# Patient Record
Sex: Male | Born: 1935 | Race: White | Hispanic: No
Health system: Southern US, Community
[De-identification: ages and names within clinical notes are randomized; demographics above are authoritative.]

## PROBLEM LIST (undated history)

## (undated) DIAGNOSIS — J449 Chronic obstructive pulmonary disease, unspecified: Secondary | ICD-10-CM

## (undated) DIAGNOSIS — I2510 Atherosclerotic heart disease of native coronary artery without angina pectoris: Secondary | ICD-10-CM

## (undated) DIAGNOSIS — K219 Gastro-esophageal reflux disease without esophagitis: Secondary | ICD-10-CM

## (undated) DIAGNOSIS — F329 Major depressive disorder, single episode, unspecified: Secondary | ICD-10-CM

## (undated) DIAGNOSIS — G4700 Insomnia, unspecified: Secondary | ICD-10-CM

## (undated) DIAGNOSIS — R609 Edema, unspecified: Secondary | ICD-10-CM

## (undated) DIAGNOSIS — F0390 Unspecified dementia without behavioral disturbance: Secondary | ICD-10-CM

## (undated) DIAGNOSIS — G894 Chronic pain syndrome: Secondary | ICD-10-CM

## (undated) DIAGNOSIS — I4891 Unspecified atrial fibrillation: Secondary | ICD-10-CM

---

## 2007-03-25 ENCOUNTER — Ambulatory Visit: Payer: Self-pay | Admitting: Family Medicine

## 2007-07-30 ENCOUNTER — Ambulatory Visit: Payer: Self-pay | Admitting: Urology

## 2007-07-30 ENCOUNTER — Other Ambulatory Visit: Payer: Self-pay

## 2007-08-13 ENCOUNTER — Ambulatory Visit: Payer: Self-pay | Admitting: Urology

## 2008-02-22 ENCOUNTER — Emergency Department: Payer: Self-pay | Admitting: Emergency Medicine

## 2008-02-22 ENCOUNTER — Other Ambulatory Visit: Payer: Self-pay

## 2008-03-04 ENCOUNTER — Emergency Department: Payer: Self-pay | Admitting: Internal Medicine

## 2008-03-04 ENCOUNTER — Other Ambulatory Visit: Payer: Self-pay

## 2009-10-27 ENCOUNTER — Inpatient Hospital Stay: Payer: Self-pay | Admitting: Internal Medicine

## 2010-02-24 ENCOUNTER — Ambulatory Visit: Payer: Self-pay

## 2012-06-20 ENCOUNTER — Inpatient Hospital Stay: Payer: Self-pay | Admitting: Internal Medicine

## 2012-06-20 LAB — CBC WITH DIFFERENTIAL/PLATELET
Basophil #: 0.1 10*3/uL (ref 0.0–0.1)
Basophil %: 1.1 %
Eosinophil %: 0.7 %
HCT: 41.9 % (ref 40.0–52.0)
HGB: 14.1 g/dL (ref 13.0–18.0)
Lymphocyte #: 1.5 10*3/uL (ref 1.0–3.6)
Lymphocyte %: 14.6 %
MCH: 29.8 pg (ref 26.0–34.0)
MCHC: 33.7 g/dL (ref 32.0–36.0)
MCV: 88 fL (ref 80–100)
Monocyte #: 0.7 x10 3/mm (ref 0.2–1.0)
Monocyte %: 6.6 %
Neutrophil %: 77 %
Platelet: 191 10*3/uL (ref 150–440)

## 2012-06-20 LAB — URINALYSIS, COMPLETE
Bacteria: NONE SEEN
Bilirubin,UR: NEGATIVE
Blood: NEGATIVE
Glucose,UR: NEGATIVE mg/dL (ref 0–75)
Specific Gravity: 1.017 (ref 1.003–1.030)
Squamous Epithelial: <1

## 2012-06-20 LAB — COMPREHENSIVE METABOLIC PANEL
Albumin: 4.2 g/dL (ref 3.4–5.0)
Alkaline Phosphatase: 80 U/L (ref 50–136)
Calcium, Total: 10.3 mg/dL — ABNORMAL HIGH (ref 8.5–10.1)
Chloride: 104 mmol/L (ref 98–107)
Creatinine: 1.25 mg/dL (ref 0.60–1.30)
EGFR (Non-African Amer.): 55 — ABNORMAL LOW
Glucose: 86 mg/dL (ref 65–99)
Osmolality: 272 (ref 275–301)
Potassium: 6.2 mmol/L — ABNORMAL HIGH (ref 3.5–5.1)

## 2012-06-20 LAB — PRO B NATRIURETIC PEPTIDE: B-Type Natriuretic Peptide: 659 pg/mL — ABNORMAL HIGH (ref 0–450)

## 2012-06-20 LAB — TROPONIN I: Troponin-I: <0.02 ng/mL

## 2012-06-20 LAB — LIPASE, BLOOD: Lipase: 177 U/L (ref 73–393)

## 2012-06-21 LAB — BASIC METABOLIC PANEL
Calcium, Total: 9.5 mg/dL (ref 8.5–10.1)
Chloride: 105 mmol/L (ref 98–107)
Co2: 23 mmol/L (ref 21–32)
Potassium: 4.1 mmol/L (ref 3.5–5.1)
Sodium: 139 mmol/L (ref 136–145)

## 2012-06-21 LAB — LIPID PANEL
Cholesterol: 113 mg/dL (ref 0–200)
HDL Cholesterol: 44 mg/dL (ref 40–60)
Triglycerides: 54 mg/dL (ref 0–200)
VLDL Cholesterol, Calc: 11 mg/dL (ref 5–40)

## 2012-06-24 LAB — CREATININE, SERUM
Creatinine: 1.3 mg/dL (ref 0.60–1.30)
EGFR (African American): >60
EGFR (Non-African Amer.): 52 — ABNORMAL LOW

## 2012-08-07 ENCOUNTER — Ambulatory Visit: Payer: Self-pay | Admitting: Family Medicine

## 2012-08-07 ENCOUNTER — Inpatient Hospital Stay: Payer: Self-pay | Admitting: Specialist

## 2012-08-07 LAB — CBC
HCT: 34.5 % — ABNORMAL LOW
HGB: 11 g/dL — ABNORMAL LOW
MCH: 28.1 pg
MCHC: 31.8 g/dL — ABNORMAL LOW
MCV: 88 fL
Platelet: 216 x10 3/mm 3
RBC: 3.91 x10 6/mm 3 — ABNORMAL LOW
RDW: 16.4 % — ABNORMAL HIGH
WBC: 7.3 x10 3/mm 3

## 2012-08-07 LAB — COMPREHENSIVE METABOLIC PANEL WITH GFR
Albumin: 3.1 g/dL — ABNORMAL LOW
Alkaline Phosphatase: 89 U/L
Anion Gap: 8
BUN: 14 mg/dL
Bilirubin,Total: 0.4 mg/dL
Calcium, Total: 9.9 mg/dL
Chloride: 105 mmol/L
Co2: 28 mmol/L
Creatinine: 1.32 mg/dL — ABNORMAL HIGH
EGFR (African American): 59 — ABNORMAL LOW
EGFR (Non-African Amer.): 51 — ABNORMAL LOW
Glucose: 95 mg/dL
Osmolality: 282
Potassium: 3.7 mmol/L
SGOT(AST): 17 U/L
SGPT (ALT): 14 U/L
Sodium: 141 mmol/L
Total Protein: 6.9 g/dL

## 2012-08-07 LAB — URINALYSIS, COMPLETE
Bacteria: NONE SEEN
Blood: NEGATIVE
Glucose,UR: NEGATIVE mg/dL (ref 0–75)
Ketone: NEGATIVE
Leukocyte Esterase: NEGATIVE
Ph: 5 (ref 4.5–8.0)
Protein: NEGATIVE
Specific Gravity: 1.018 (ref 1.003–1.030)
Squamous Epithelial: NONE SEEN

## 2012-08-07 LAB — TROPONIN I: Troponin-I: <0.02 ng/mL

## 2012-08-07 LAB — MAGNESIUM: Magnesium: 2 mg/dL

## 2012-08-07 LAB — CK TOTAL AND CKMB (NOT AT ARMC)
CK, Total: 55 U/L
CK-MB: 1.2 ng/mL

## 2012-08-08 LAB — CK TOTAL AND CKMB (NOT AT ARMC)
CK, Total: 38 U/L (ref 35–232)
CK-MB: 0.8 ng/mL (ref 0.5–3.6)

## 2012-08-08 LAB — TROPONIN I: Troponin-I: <0.02 ng/mL

## 2012-08-23 ENCOUNTER — Emergency Department: Payer: Self-pay | Admitting: Emergency Medicine

## 2012-08-23 LAB — URINALYSIS, COMPLETE
Bacteria: NONE SEEN
Bilirubin,UR: NEGATIVE
Leukocyte Esterase: NEGATIVE
RBC,UR: 2 /HPF (ref 0–5)
Squamous Epithelial: NONE SEEN
WBC UR: 1 /HPF (ref 0–5)

## 2012-08-23 LAB — COMPREHENSIVE METABOLIC PANEL
Albumin: 3.1 g/dL — ABNORMAL LOW (ref 3.4–5.0)
Alkaline Phosphatase: 76 U/L (ref 50–136)
Anion Gap: 6 — ABNORMAL LOW (ref 7–16)
BUN: 19 mg/dL — ABNORMAL HIGH (ref 7–18)
Calcium, Total: 9.4 mg/dL (ref 8.5–10.1)
Co2: 28 mmol/L (ref 21–32)
Creatinine: 1.3 mg/dL (ref 0.60–1.30)
EGFR (Non-African Amer.): 52 — ABNORMAL LOW
Glucose: 79 mg/dL (ref 65–99)
Osmolality: 286 (ref 275–301)
SGPT (ALT): 20 U/L (ref 12–78)
Sodium: 143 mmol/L (ref 136–145)

## 2012-08-23 LAB — CBC
HGB: 12 g/dL — ABNORMAL LOW (ref 13.0–18.0)
MCH: 29.8 pg (ref 26.0–34.0)
MCHC: 33.7 g/dL (ref 32.0–36.0)
Platelet: 127 10*3/uL — ABNORMAL LOW (ref 150–440)
RBC: 4.02 10*6/uL — ABNORMAL LOW (ref 4.40–5.90)
RDW: 17.3 % — ABNORMAL HIGH (ref 11.5–14.5)

## 2013-04-19 ENCOUNTER — Emergency Department: Payer: Self-pay | Admitting: Emergency Medicine

## 2013-09-01 ENCOUNTER — Inpatient Hospital Stay: Payer: Self-pay | Admitting: Internal Medicine

## 2013-09-01 LAB — COMPREHENSIVE METABOLIC PANEL
Albumin: 3.4 g/dL (ref 3.4–5.0)
Alkaline Phosphatase: 70 U/L
Anion Gap: 4 — ABNORMAL LOW (ref 7–16)
BUN: 32 mg/dL — ABNORMAL HIGH (ref 7–18)
Calcium, Total: 11.1 mg/dL — ABNORMAL HIGH (ref 8.5–10.1)
Chloride: 107 mmol/L (ref 98–107)
Co2: 26 mmol/L (ref 21–32)
EGFR (African American): 39 — ABNORMAL LOW
EGFR (Non-African Amer.): 33 — ABNORMAL LOW
Glucose: 111 mg/dL — ABNORMAL HIGH (ref 65–99)
Potassium: 3.7 mmol/L (ref 3.5–5.1)
SGOT(AST): 30 U/L (ref 15–37)
Sodium: 137 mmol/L (ref 136–145)
Total Protein: 7.4 g/dL (ref 6.4–8.2)

## 2013-09-01 LAB — CK TOTAL AND CKMB (NOT AT ARMC): CK-MB: <0.5 ng/mL — ABNORMAL LOW (ref 0.5–3.6)

## 2013-09-01 LAB — CBC
HGB: 12.6 g/dL — ABNORMAL LOW (ref 13.0–18.0)
MCH: 28.8 pg (ref 26.0–34.0)
MCHC: 33.3 g/dL (ref 32.0–36.0)
MCV: 87 fL (ref 80–100)
Platelet: 178 10*3/uL (ref 150–440)
RBC: 4.36 10*6/uL — ABNORMAL LOW (ref 4.40–5.90)
RDW: 14.9 % — ABNORMAL HIGH (ref 11.5–14.5)
WBC: 19.3 10*3/uL — ABNORMAL HIGH (ref 3.8–10.6)

## 2013-09-01 LAB — PRO B NATRIURETIC PEPTIDE: B-Type Natriuretic Peptide: 935 pg/mL — ABNORMAL HIGH (ref 0–450)

## 2013-09-01 LAB — TROPONIN I
Troponin-I: <0.02 ng/mL
Troponin-I: <0.02 ng/mL

## 2013-09-01 LAB — PROTIME-INR: INR: 1

## 2013-09-02 LAB — CBC WITH DIFFERENTIAL/PLATELET
Eosinophil %: 0.2 %
HGB: 11.1 g/dL — ABNORMAL LOW (ref 13.0–18.0)
Lymphocyte #: 1.3 10*3/uL (ref 1.0–3.6)
Lymphocyte %: 8.5 %
Neutrophil #: 12.4 10*3/uL — ABNORMAL HIGH (ref 1.4–6.5)
Platelet: 155 10*3/uL (ref 150–440)
WBC: 15.4 10*3/uL — ABNORMAL HIGH (ref 3.8–10.6)

## 2013-09-02 LAB — BASIC METABOLIC PANEL
Anion Gap: 6 — ABNORMAL LOW (ref 7–16)
BUN: 27 mg/dL — ABNORMAL HIGH (ref 7–18)
Calcium, Total: 9.8 mg/dL (ref 8.5–10.1)
Chloride: 107 mmol/L (ref 98–107)
Co2: 24 mmol/L (ref 21–32)
Creatinine: 1.68 mg/dL — ABNORMAL HIGH (ref 0.60–1.30)
EGFR (African American): 44 — ABNORMAL LOW
Osmolality: 279 (ref 275–301)
Potassium: 3.8 mmol/L (ref 3.5–5.1)
Sodium: 137 mmol/L (ref 136–145)

## 2013-09-02 LAB — CK TOTAL AND CKMB (NOT AT ARMC): CK-MB: <0.5 ng/mL — ABNORMAL LOW (ref 0.5–3.6)

## 2013-09-03 LAB — CBC WITH DIFFERENTIAL/PLATELET
Basophil #: 0 10*3/uL (ref 0.0–0.1)
Eosinophil %: 0 %
HGB: 10.8 g/dL — ABNORMAL LOW (ref 13.0–18.0)
Lymphocyte #: 0.5 10*3/uL — ABNORMAL LOW (ref 1.0–3.6)
Lymphocyte %: 5.5 %
MCV: 85 fL (ref 80–100)
Neutrophil %: 90.1 %
RDW: 15.2 % — ABNORMAL HIGH (ref 11.5–14.5)

## 2013-09-03 LAB — BASIC METABOLIC PANEL
Anion Gap: 8 (ref 7–16)
BUN: 25 mg/dL — ABNORMAL HIGH (ref 7–18)
Calcium, Total: 9.5 mg/dL (ref 8.5–10.1)
Chloride: 108 mmol/L — ABNORMAL HIGH (ref 98–107)
Co2: 22 mmol/L (ref 21–32)
EGFR (Non-African Amer.): 48 — ABNORMAL LOW
Glucose: 160 mg/dL — ABNORMAL HIGH (ref 65–99)
Sodium: 138 mmol/L (ref 136–145)

## 2013-09-04 LAB — CREATININE, SERUM
Creatinine: 1.44 mg/dL — ABNORMAL HIGH (ref 0.60–1.30)
EGFR (Non-African Amer.): 46 — ABNORMAL LOW

## 2013-09-04 LAB — HEMOGLOBIN: HGB: 10.4 g/dL — ABNORMAL LOW (ref 13.0–18.0)

## 2013-09-06 LAB — CULTURE, BLOOD (SINGLE)

## 2013-10-04 LAB — EXPECTORATED SPUTUM ASSESSMENT W GRAM STAIN, RFLX TO RESP C

## 2013-10-28 ENCOUNTER — Inpatient Hospital Stay: Payer: Self-pay | Admitting: Internal Medicine

## 2013-10-28 LAB — URINALYSIS, COMPLETE
BILIRUBIN, UR: NEGATIVE
Bacteria: NONE SEEN
GLUCOSE, UR: NEGATIVE mg/dL (ref 0–75)
Hyaline Cast: 3
Ketone: NEGATIVE
LEUKOCYTE ESTERASE: NEGATIVE
NITRITE: NEGATIVE
Ph: 5 (ref 4.5–8.0)
Protein: 30
RBC,UR: 8 /HPF (ref 0–5)
Specific Gravity: 1.026 (ref 1.003–1.030)
Squamous Epithelial: <1
WBC UR: 3 /HPF (ref 0–5)

## 2013-10-28 LAB — COMPREHENSIVE METABOLIC PANEL
Albumin: 3.4 g/dL (ref 3.4–5.0)
Alkaline Phosphatase: 62 U/L
Anion Gap: 5 — ABNORMAL LOW (ref 7–16)
BILIRUBIN TOTAL: 0.4 mg/dL (ref 0.2–1.0)
BUN: 29 mg/dL — ABNORMAL HIGH (ref 7–18)
Calcium, Total: 10 mg/dL (ref 8.5–10.1)
Chloride: 106 mmol/L (ref 98–107)
Co2: 25 mmol/L (ref 21–32)
Creatinine: 1.99 mg/dL — ABNORMAL HIGH (ref 0.60–1.30)
GFR CALC AF AMER: 36 — AB
GFR CALC NON AF AMER: 31 — AB
GLUCOSE: 111 mg/dL — AB (ref 65–99)
Osmolality: 278 (ref 275–301)
Potassium: 3.7 mmol/L (ref 3.5–5.1)
SGOT(AST): 29 U/L (ref 15–37)
SGPT (ALT): 18 U/L (ref 12–78)
SODIUM: 136 mmol/L (ref 136–145)
Total Protein: 7 g/dL (ref 6.4–8.2)

## 2013-10-28 LAB — CBC
HCT: 35.1 % — ABNORMAL LOW (ref 40.0–52.0)
HGB: 11.8 g/dL — ABNORMAL LOW (ref 13.0–18.0)
MCH: 29.8 pg (ref 26.0–34.0)
MCHC: 33.6 g/dL (ref 32.0–36.0)
MCV: 89 fL (ref 80–100)
PLATELETS: 163 10*3/uL (ref 150–440)
RBC: 3.96 10*6/uL — AB (ref 4.40–5.90)
RDW: 15.1 % — AB (ref 11.5–14.5)
WBC: 14.2 10*3/uL — AB (ref 3.8–10.6)

## 2013-10-28 LAB — CK TOTAL AND CKMB (NOT AT ARMC)
CK, Total: 101 U/L (ref 35–232)
CK-MB: 1 ng/mL (ref 0.5–3.6)

## 2013-10-28 LAB — TROPONIN I
Troponin-I: <0.02 ng/mL
Troponin-I: <0.02 ng/mL

## 2013-10-28 LAB — RAPID INFLUENZA A&B ANTIGENS (ARMC ONLY)

## 2013-10-28 LAB — PRO B NATRIURETIC PEPTIDE: B-Type Natriuretic Peptide: 365 pg/mL (ref 0–450)

## 2013-10-29 LAB — BASIC METABOLIC PANEL
Anion Gap: 7 (ref 7–16)
BUN: 31 mg/dL — ABNORMAL HIGH (ref 7–18)
CHLORIDE: 104 mmol/L (ref 98–107)
Calcium, Total: 10.9 mg/dL — ABNORMAL HIGH (ref 8.5–10.1)
Co2: 23 mmol/L (ref 21–32)
Creatinine: 1.93 mg/dL — ABNORMAL HIGH (ref 0.60–1.30)
GFR CALC AF AMER: 37 — AB
GFR CALC NON AF AMER: 32 — AB
Glucose: 267 mg/dL — ABNORMAL HIGH (ref 65–99)
OSMOLALITY: 284 (ref 275–301)
Potassium: 4 mmol/L (ref 3.5–5.1)
SODIUM: 134 mmol/L — AB (ref 136–145)

## 2013-10-29 LAB — CBC WITH DIFFERENTIAL/PLATELET
Basophil #: 0 10*3/uL (ref 0.0–0.1)
Basophil %: 0.2 %
Eosinophil #: 0 10*3/uL (ref 0.0–0.7)
Eosinophil %: 0 %
HCT: 34.4 % — AB (ref 40.0–52.0)
HGB: 11.6 g/dL — ABNORMAL LOW (ref 13.0–18.0)
Lymphocyte #: 0.3 10*3/uL — ABNORMAL LOW (ref 1.0–3.6)
Lymphocyte %: 2.7 %
MCH: 29.7 pg (ref 26.0–34.0)
MCHC: 33.7 g/dL (ref 32.0–36.0)
MCV: 88 fL (ref 80–100)
Monocyte #: 0.3 x10 3/mm (ref 0.2–1.0)
Monocyte %: 2 %
NEUTROS ABS: 12.3 10*3/uL — AB (ref 1.4–6.5)
Neutrophil %: 95.1 %
Platelet: 160 10*3/uL (ref 150–440)
RBC: 3.9 10*6/uL — AB (ref 4.40–5.90)
RDW: 14.9 % — AB (ref 11.5–14.5)
WBC: 13 10*3/uL — AB (ref 3.8–10.6)

## 2013-10-29 LAB — TROPONIN I

## 2013-11-02 LAB — CULTURE, BLOOD (SINGLE)

## 2014-03-20 ENCOUNTER — Emergency Department: Payer: Self-pay | Admitting: Emergency Medicine

## 2014-03-21 LAB — URINALYSIS, COMPLETE
BLOOD: NEGATIVE
Bacteria: NONE SEEN
Bilirubin,UR: NEGATIVE
Glucose,UR: NEGATIVE mg/dL (ref 0–75)
Ketone: NEGATIVE
Leukocyte Esterase: NEGATIVE
Nitrite: NEGATIVE
Ph: 5 (ref 4.5–8.0)
Protein: NEGATIVE
RBC,UR: 4 /HPF (ref 0–5)
SQUAMOUS EPITHELIAL: NONE SEEN
Specific Gravity: 1.019 (ref 1.003–1.030)
WBC UR: 2 /HPF (ref 0–5)

## 2014-03-21 LAB — CBC
HCT: 36.2 % — AB (ref 40.0–52.0)
HGB: 11.9 g/dL — ABNORMAL LOW (ref 13.0–18.0)
MCH: 28.2 pg (ref 26.0–34.0)
MCHC: 32.8 g/dL (ref 32.0–36.0)
MCV: 86 fL (ref 80–100)
Platelet: 233 10*3/uL (ref 150–440)
RBC: 4.22 10*6/uL — AB (ref 4.40–5.90)
RDW: 14.8 % — AB (ref 11.5–14.5)
WBC: 14.1 10*3/uL — AB (ref 3.8–10.6)

## 2014-03-21 LAB — BASIC METABOLIC PANEL
ANION GAP: 8 (ref 7–16)
BUN: 26 mg/dL — AB (ref 7–18)
Calcium, Total: 10.3 mg/dL — ABNORMAL HIGH (ref 8.5–10.1)
Chloride: 103 mmol/L (ref 98–107)
Co2: 24 mmol/L (ref 21–32)
Creatinine: 2.07 mg/dL — ABNORMAL HIGH (ref 0.60–1.30)
EGFR (African American): 34 — ABNORMAL LOW
GFR CALC NON AF AMER: 29 — AB
GLUCOSE: 101 mg/dL — AB (ref 65–99)
Osmolality: 275 (ref 275–301)
POTASSIUM: 3.8 mmol/L (ref 3.5–5.1)
SODIUM: 135 mmol/L — AB (ref 136–145)

## 2014-03-21 LAB — TROPONIN I: Troponin-I: <0.02 ng/mL

## 2014-03-21 LAB — PRO B NATRIURETIC PEPTIDE: B-TYPE NATIURETIC PEPTID: 300 pg/mL (ref 0–450)

## 2014-06-04 ENCOUNTER — Ambulatory Visit: Payer: Self-pay

## 2014-06-04 ENCOUNTER — Emergency Department: Payer: Self-pay | Admitting: Emergency Medicine

## 2014-06-04 LAB — URINALYSIS, COMPLETE
BILIRUBIN, UR: NEGATIVE
BLOOD: NEGATIVE
Bacteria: NONE SEEN
GLUCOSE, UR: NEGATIVE mg/dL (ref 0–75)
Ketone: NEGATIVE
Leukocyte Esterase: NEGATIVE
NITRITE: NEGATIVE
Ph: 5 (ref 4.5–8.0)
Protein: NEGATIVE
Specific Gravity: 1.01 (ref 1.003–1.030)
WBC UR: <1 /HPF (ref 0–5)

## 2014-06-04 LAB — CBC
HCT: 37.4 % — AB (ref 40.0–52.0)
HGB: 12 g/dL — ABNORMAL LOW (ref 13.0–18.0)
MCH: 27.6 pg (ref 26.0–34.0)
MCHC: 32.1 g/dL (ref 32.0–36.0)
MCV: 86 fL (ref 80–100)
Platelet: 186 10*3/uL (ref 150–440)
RBC: 4.35 10*6/uL — ABNORMAL LOW (ref 4.40–5.90)
RDW: 16.3 % — AB (ref 11.5–14.5)
WBC: 7.8 10*3/uL (ref 3.8–10.6)

## 2014-06-04 LAB — COMPREHENSIVE METABOLIC PANEL
ALBUMIN: 3.7 g/dL (ref 3.4–5.0)
ANION GAP: 7 (ref 7–16)
AST: 12 U/L — AB (ref 15–37)
Alkaline Phosphatase: 63 U/L
BILIRUBIN TOTAL: 0.3 mg/dL (ref 0.2–1.0)
BUN: 21 mg/dL — AB (ref 7–18)
CALCIUM: 9.9 mg/dL (ref 8.5–10.1)
CHLORIDE: 108 mmol/L — AB (ref 98–107)
CO2: 25 mmol/L (ref 21–32)
Creatinine: 2.04 mg/dL — ABNORMAL HIGH (ref 0.60–1.30)
EGFR (Non-African Amer.): 30 — ABNORMAL LOW
GFR CALC AF AMER: 35 — AB
GLUCOSE: 112 mg/dL — AB (ref 65–99)
OSMOLALITY: 283 (ref 275–301)
POTASSIUM: 3.7 mmol/L (ref 3.5–5.1)
SGPT (ALT): 12 U/L — ABNORMAL LOW
SODIUM: 140 mmol/L (ref 136–145)
TOTAL PROTEIN: 6.7 g/dL (ref 6.4–8.2)

## 2014-06-04 LAB — CK TOTAL AND CKMB (NOT AT ARMC)
CK, Total: 102 U/L
CK-MB: 1.1 ng/mL (ref 0.5–3.6)

## 2014-06-04 LAB — TROPONIN I: Troponin-I: <0.02 ng/mL

## 2014-06-04 LAB — LIPASE, BLOOD: LIPASE: 139 U/L (ref 73–393)

## 2014-07-31 ENCOUNTER — Emergency Department: Payer: Self-pay | Admitting: Emergency Medicine

## 2014-07-31 LAB — COMPREHENSIVE METABOLIC PANEL
ALBUMIN: 3.5 g/dL (ref 3.4–5.0)
ALT: 14 U/L
ANION GAP: 7 (ref 7–16)
Alkaline Phosphatase: 78 U/L
BILIRUBIN TOTAL: 0.3 mg/dL (ref 0.2–1.0)
BUN: 22 mg/dL — ABNORMAL HIGH (ref 7–18)
CREATININE: 1.82 mg/dL — AB (ref 0.60–1.30)
Calcium, Total: 9.8 mg/dL (ref 8.5–10.1)
Chloride: 104 mmol/L (ref 98–107)
Co2: 30 mmol/L (ref 21–32)
EGFR (African American): 46 — ABNORMAL LOW
EGFR (Non-African Amer.): 38 — ABNORMAL LOW
Glucose: 95 mg/dL (ref 65–99)
OSMOLALITY: 284 (ref 275–301)
Potassium: 4 mmol/L (ref 3.5–5.1)
SGOT(AST): 20 U/L (ref 15–37)
Sodium: 141 mmol/L (ref 136–145)
Total Protein: 7 g/dL (ref 6.4–8.2)

## 2014-07-31 LAB — CBC
HCT: 38.6 % — ABNORMAL LOW (ref 40.0–52.0)
HGB: 12.4 g/dL — ABNORMAL LOW (ref 13.0–18.0)
MCH: 27.8 pg (ref 26.0–34.0)
MCHC: 32.1 g/dL (ref 32.0–36.0)
MCV: 87 fL (ref 80–100)
PLATELETS: 269 10*3/uL (ref 150–440)
RBC: 4.46 10*6/uL (ref 4.40–5.90)
RDW: 15.6 % — ABNORMAL HIGH (ref 11.5–14.5)
WBC: 7.5 10*3/uL (ref 3.8–10.6)

## 2014-07-31 LAB — TROPONIN I: Troponin-I: <0.02 ng/mL

## 2014-08-18 ENCOUNTER — Emergency Department: Payer: Self-pay | Admitting: Emergency Medicine

## 2014-08-18 LAB — CBC
HCT: 35.2 % — ABNORMAL LOW (ref 40.0–52.0)
HGB: 11.8 g/dL — ABNORMAL LOW (ref 13.0–18.0)
MCH: 29.4 pg (ref 26.0–34.0)
MCHC: 33.5 g/dL (ref 32.0–36.0)
MCV: 88 fL (ref 80–100)
PLATELETS: 212 10*3/uL (ref 150–440)
RBC: 4.02 10*6/uL — ABNORMAL LOW (ref 4.40–5.90)
RDW: 15.7 % — ABNORMAL HIGH (ref 11.5–14.5)
WBC: 7.8 10*3/uL (ref 3.8–10.6)

## 2014-08-18 LAB — COMPREHENSIVE METABOLIC PANEL
ALT: 14 U/L
AST: 17 U/L (ref 15–37)
Albumin: 3.6 g/dL (ref 3.4–5.0)
Alkaline Phosphatase: 65 U/L
Anion Gap: 4 — ABNORMAL LOW (ref 7–16)
BILIRUBIN TOTAL: 0.3 mg/dL (ref 0.2–1.0)
BUN: 20 mg/dL — ABNORMAL HIGH (ref 7–18)
CHLORIDE: 103 mmol/L (ref 98–107)
CREATININE: 2.22 mg/dL — AB (ref 0.60–1.30)
Calcium, Total: 9.5 mg/dL (ref 8.5–10.1)
Co2: 28 mmol/L (ref 21–32)
Glucose: 128 mg/dL — ABNORMAL HIGH (ref 65–99)
OSMOLALITY: 274 (ref 275–301)
POTASSIUM: 4.2 mmol/L (ref 3.5–5.1)
Sodium: 135 mmol/L — ABNORMAL LOW (ref 136–145)
Total Protein: 6.7 g/dL (ref 6.4–8.2)

## 2014-08-18 LAB — TROPONIN I

## 2014-08-26 ENCOUNTER — Emergency Department: Payer: Self-pay | Admitting: Emergency Medicine

## 2014-08-26 LAB — CBC
HCT: 36.2 % — AB (ref 40.0–52.0)
HGB: 11.7 g/dL — ABNORMAL LOW (ref 13.0–18.0)
MCH: 29.3 pg (ref 26.0–34.0)
MCHC: 32.4 g/dL (ref 32.0–36.0)
MCV: 91 fL (ref 80–100)
PLATELETS: 182 10*3/uL (ref 150–440)
RBC: 4.01 10*6/uL — ABNORMAL LOW (ref 4.40–5.90)
RDW: 19.2 % — AB (ref 11.5–14.5)
WBC: 12.8 10*3/uL — ABNORMAL HIGH (ref 3.8–10.6)

## 2014-08-26 LAB — COMPREHENSIVE METABOLIC PANEL
ALBUMIN: 3.5 g/dL (ref 3.4–5.0)
ALK PHOS: 72 U/L
AST: 17 U/L (ref 15–37)
Anion Gap: 6 — ABNORMAL LOW (ref 7–16)
BILIRUBIN TOTAL: 0.4 mg/dL (ref 0.2–1.0)
BUN: 26 mg/dL — ABNORMAL HIGH (ref 7–18)
CHLORIDE: 104 mmol/L (ref 98–107)
Calcium, Total: 9.4 mg/dL (ref 8.5–10.1)
Co2: 30 mmol/L (ref 21–32)
Creatinine: 1.86 mg/dL — ABNORMAL HIGH (ref 0.60–1.30)
EGFR (African American): 45 — ABNORMAL LOW
GFR CALC NON AF AMER: 37 — AB
Glucose: 127 mg/dL — ABNORMAL HIGH (ref 65–99)
Osmolality: 286 (ref 275–301)
Potassium: 3.7 mmol/L (ref 3.5–5.1)
SGPT (ALT): 14 U/L
SODIUM: 140 mmol/L (ref 136–145)
Total Protein: 6.7 g/dL (ref 6.4–8.2)

## 2014-08-26 LAB — LIPASE, BLOOD: LIPASE: 88 U/L (ref 73–393)

## 2014-08-27 LAB — URINALYSIS, COMPLETE
BACTERIA: NONE SEEN
Bilirubin,UR: NEGATIVE
Blood: NEGATIVE
Glucose,UR: NEGATIVE mg/dL (ref 0–75)
KETONE: NEGATIVE
Leukocyte Esterase: NEGATIVE
Nitrite: NEGATIVE
PROTEIN: NEGATIVE
Ph: 6 (ref 4.5–8.0)
RBC,UR: 2 /HPF (ref 0–5)
Specific Gravity: 1.023 (ref 1.003–1.030)
Squamous Epithelial: NONE SEEN
WBC UR: 1 /HPF (ref 0–5)

## 2014-12-04 IMAGING — CT CT ABD-PELV W/ CM
2 of 5 series · 16 of 46 positions shown, 18 images · IV contrast (isovue)
Comparison: 08/23/2012

CLINICAL DATA: Nausea vomiting and panic attacks. Recent treatment
for shingles. Right-sided pain.

EXAM:
CT ABDOMEN AND PELVIS WITH CONTRAST
TECHNIQUE: Multidetector CT imaging of the abdomen and pelvis was performed
using the standard protocol following bolus administration of
intravenous contrast.
CONTRAST:  100 cc of Isovue 300

[Series 2: routine abd pel with · axial · 0.68mm/px · z∈[-1084,-684]mm · 13 of 90 slices shown, 15 images]
[im 5/90  soft-tissue]
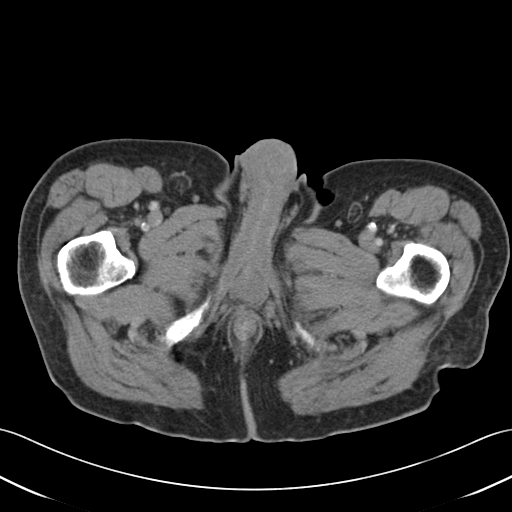
[im 5/90  bone]
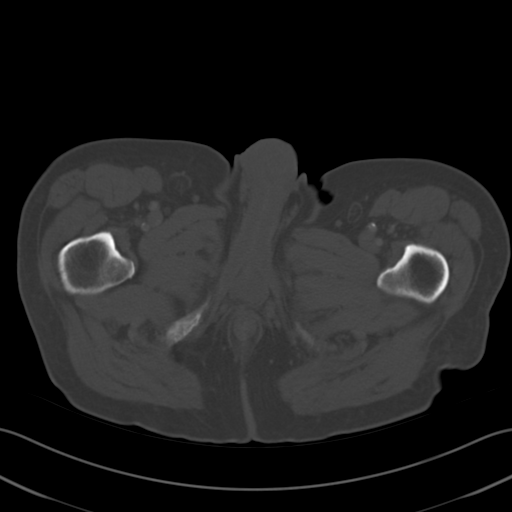
[im 14/90  soft-tissue]
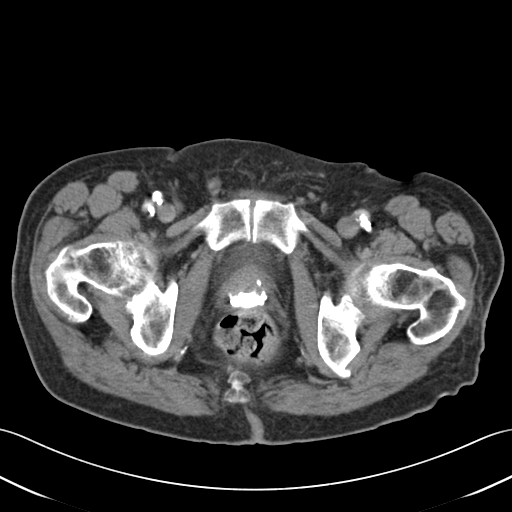
[im 18/90  soft-tissue]
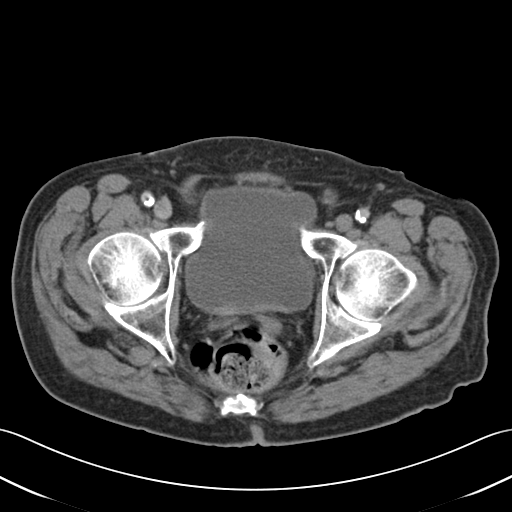
[im 27/90  soft-tissue]
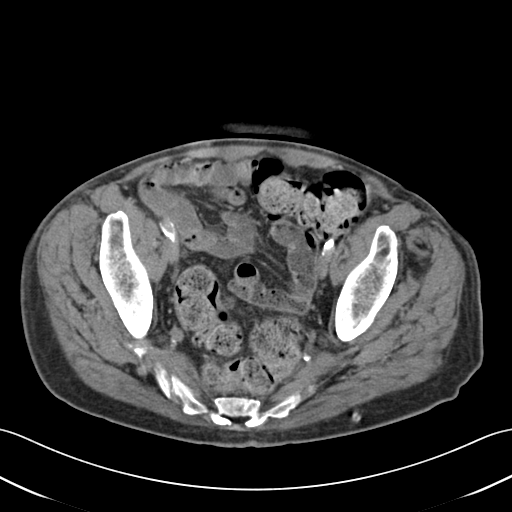
[im 32/90  soft-tissue]
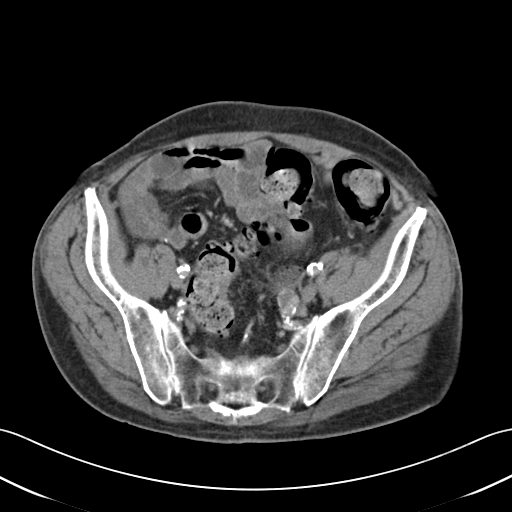
[im 41/90  soft-tissue]
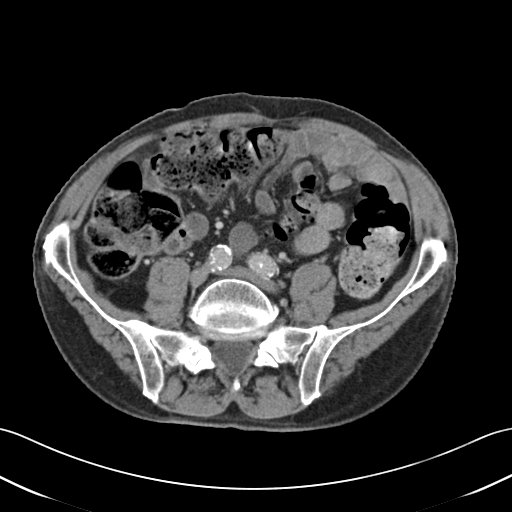
[im 45/90  soft-tissue]
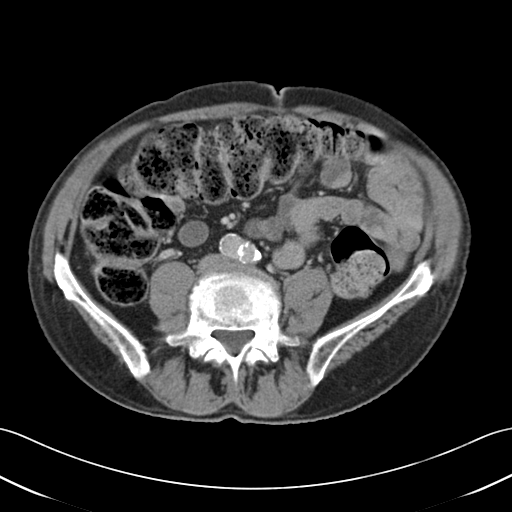
[im 49/90  soft-tissue]
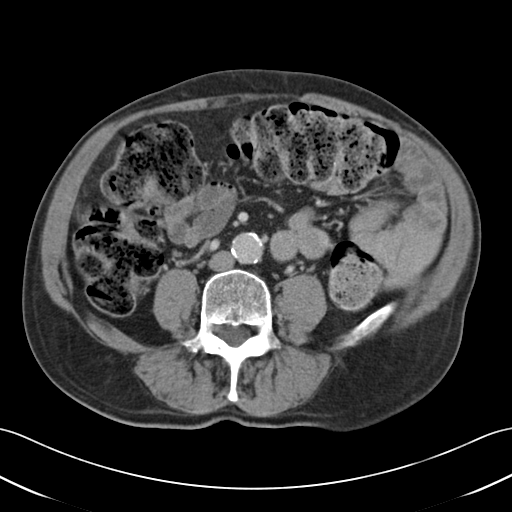
[im 58/90  soft-tissue]
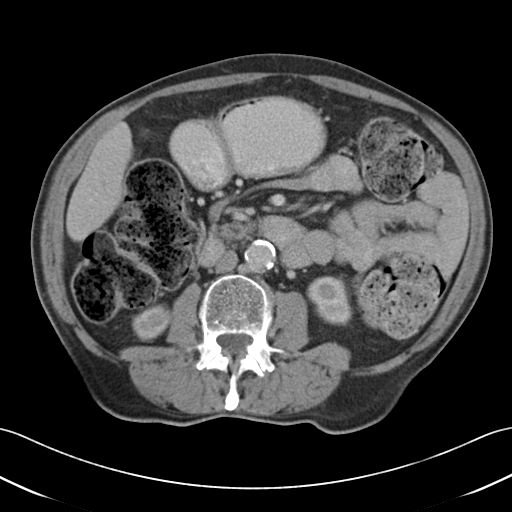
[im 58/90  bone]
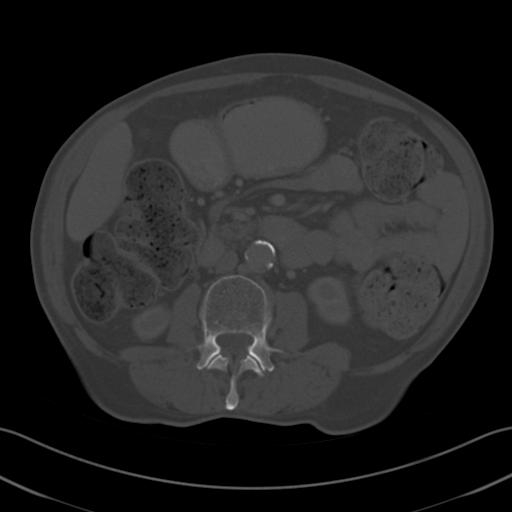
[im 63/90  soft-tissue]
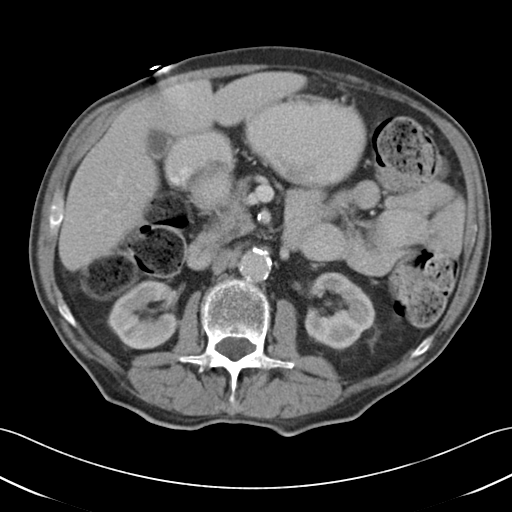
[im 72/90  soft-tissue]
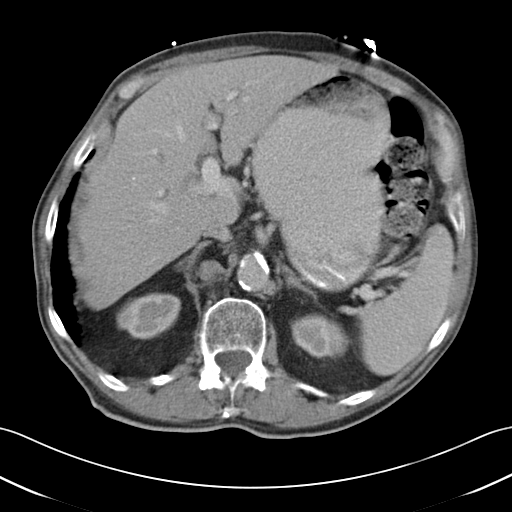
[im 76/90  soft-tissue]
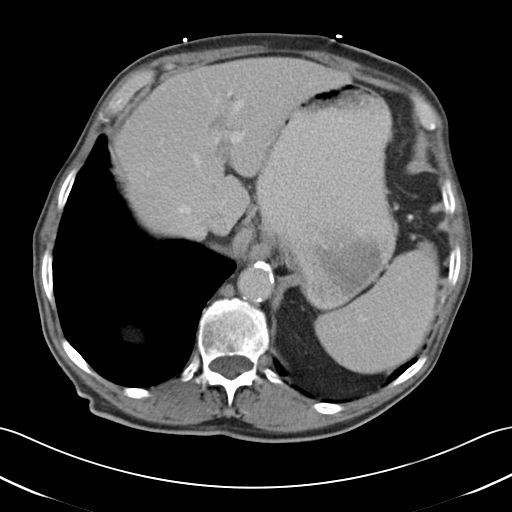
[im 85/90  soft-tissue]
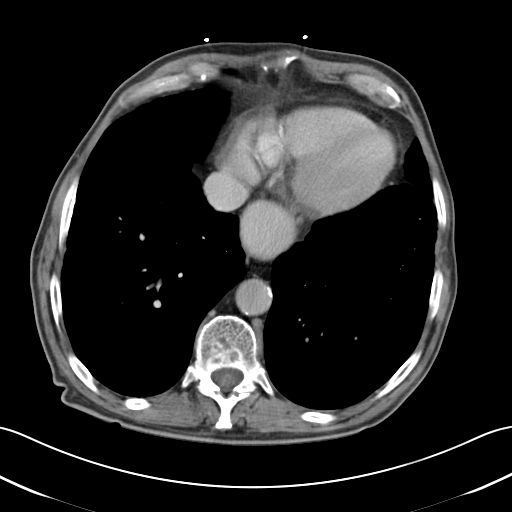

[Series 6: cor routine abd pel with · coronal · 0.94mm/px · 3 of 138 slices shown]
[im 46/138  soft-tissue]
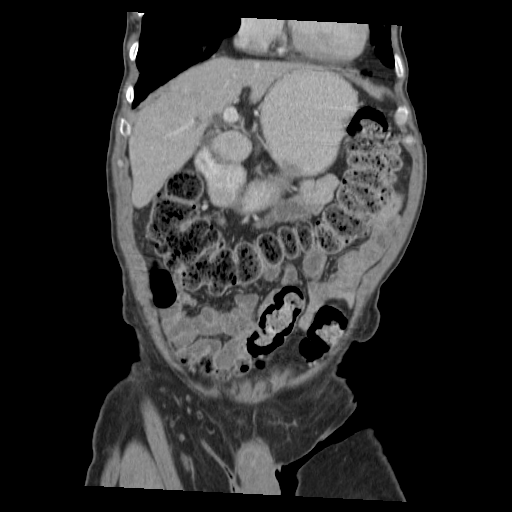
[im 61/138  soft-tissue]
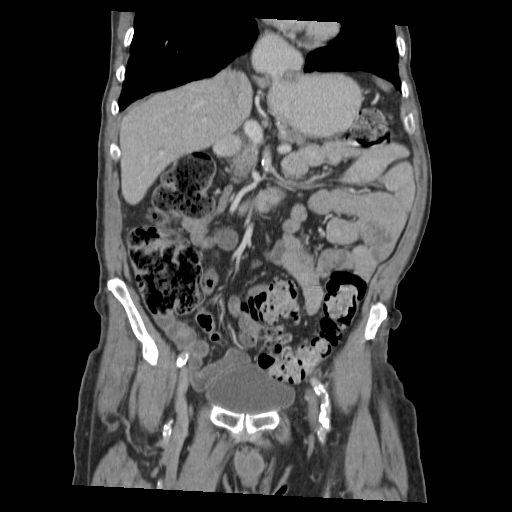
[im 77/138  soft-tissue]
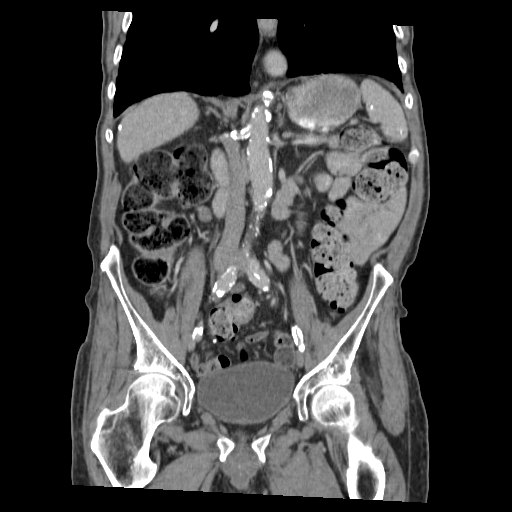

[16 of 46 positions shown; findings below may reference images not displayed]

FINDINGS: Lower chest: Peripheral and lower lobe predominant interstitial
reticulation is identified. No pleural effusion or consolidation
noted.

Hepatobiliary: No suspicious liver abnormality noted. There is mild
intrahepatic biliary prominence. The gallbladder appears normal. No
common bile duct dilatation.

Pancreas: The pancreas is unremarkable.

Spleen: Calcified splenic granulomas noted.

Adrenals/Urinary Tract: Normal adrenal glands. 9 mm low attenuation
structure within the upper pole of the left kidney is too small to
characterize. The right kidney is normal. The urinary bladder
appears normal.

Stomach/Bowel: Large hiatal hernia. Moderate distension of the
stomach. The small bowel loops are unremarkable. A moderate to
marked stool burden identified throughout the colon.

Vascular/Lymphatic: Calcified atherosclerotic disease involves the
abdominal aorta. No aneurysm. No enlarged retroperitoneal or
mesenteric adenopathy. No enlarged pelvic or inguinal lymph nodes.

Reproductive: Partially calcified prostate gland noted.

Other: There is no ascites or focal fluid collections within the
abdomen or pelvis. The bones are osteopenic. Stable, likely benign
sclerotic lesion in the right iliac bone measuring 1.1 cm, image
52/series 2.

Musculoskeletal: Posterior disc bulge/herniation noted at L4-5.
IMPRESSION: 1. No acute findings identified within the abdomen or pelvis.
2. Moderate to marked stool burden within the colon. Correlate for
any clinical signs or symptoms of constipation.
3. Mild intrahepatic biliary prominence with normal common bile duct
and gallbladder.
4. Atherosclerosis
5. Posterior disc bulge/herniation at L4-5.
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
## 2015-01-19 NOTE — Discharge Summary (Signed)
PATIENT NAME:  Karl Mills, Karl Mills MR#:  [id number] DATE OF BIRTH:  June 02, 1932  DATE OF ADMISSION:  08/07/2012 DATE OF DISCHARGE:  08/10/2012  For a detailed note, please take a look at the history and physical done on admission by Dr. Dava NajjarPanwar.   DIAGNOSES AT DISCHARGE:  1. Acute respiratory failure secondary to chronic obstructive pulmonary disease exacerbation.  2. Chronic obstructive pulmonary disease exacerbation.  3. Hyperlipidemia.  4. Gastroesophageal reflux disease. 5. Depression. 6. Dementia.   DIET: Patient is being discharged on a low sodium, low fat diet.   ACTIVITY: As tolerated.   FOLLOW UP: Follow up is with his primary care physician, Dr. Cay SchillingsJack Wolf, in the next 1 to 2 weeks.   DISCHARGE MEDICATIONS:  1. Ranitidine 150 mg b.i.d.  2. Atorvastatin 80 mg daily.  3. Symbicort 160/4.5, 2 puffs b.i.d.  4. Sublingual nitroglycerin as needed.  5. Spiriva 1 puff daily.  6. Zoloft 50 mg daily.  7. Levaquin 250 mg daily x5 days.  8. Prednisone taper starting at 60 mg, down to 10 mg over the next 12 days.   LABORATORY, DIAGNOSTIC AND RADIOLOGICAL DATA: Pertinent studies done during her hospital course are as follows: Chest x-ray done on admission showing no acute cardiopulmonary disease. Blood cultures showing no growth.   BRIEF HOSPITAL COURSE: This is a 81 year old male with medical problems as mentioned above who presented to the hospital with acute onset of shortness of breath, wheezing and cough and noted to be in chronic obstructive pulmonary disease exacerbation.  1. Acute respiratory failure. This was likely secondary to chronic obstructive pulmonary disease exacerbation. Patient has a long history of tobacco abuse and still continues to smoke presented with bronchospasm, wheezing, shortness of breath and cough. His chest x-ray did not show any evidence of acute pneumonia although he was started on empiric Levaquin, IV steroids, around-the-clock nebulizer treatments and also  his maintenance inhalers including Spiriva and Symbicort. Patient's clinical symptoms after being on therapy have significantly improved. He is not currently wheezing. He was ambulated on room air, did not de-sat below 92%. He is currently being discharged on his Symbicort, Spiriva along with a prednisone taper and empiric antibiotics as stated.  2. Chronic obstructive pulmonary disease exacerbation. Again this was likely the cause of patient's respiratory failure. He was managed with IV steroids, around-the-clock nebulizer treatments and maintenance of his Symbicort and Spiriva. He is currently being discharged on a prednisone taper, empiric Levaquin as stated along with his Spiriva and Symbicort. He was ambulated on room air but did not qualify for the need of home oxygen. He was also strongly advised to quit smoking.  3. Depression. Patient was maintained on Zoloft, he will resume that.  4. Hyperlipidemia. Patient was maintained on his atorvastatin, he will resume that.  5. Gastroesophageal reflux disease. Patient was maintained on his ranitidine, he will resume that.   6. CODE STATUS: Patient is a FULL CODE.   TIME SPENT WITH DISCHARGE: 40 minutes.  ____________________________ Rolly PancakeVivek J. Cherlynn KaiserSainani, MD vjs:cms D: 08/10/2012 13:56:34 ET T: 08/12/2012 10:44:04 ET JOB#: [id number]  cc: Rolly PancakeVivek J. Cherlynn KaiserSainani, MD, <Dictator> Mickie HillierJack H. Sheppard PentonWolf, MD Houston SirenVIVEK J Nateisha Moyd MD ELECTRONICALLY SIGNED 08/15/2012 8:11

## 2015-01-19 NOTE — Discharge Summary (Signed)
PATIENT NAME:  Karl Mills, Karl Mills MR#:  [id number] DATE OF BIRTH:  23-Aug-1932  DATE OF ADMISSION:  06/20/2012 DATE OF DISCHARGE:  06/24/2012  PRIMARY MEDICAL DOCTOR: Dr. Cay Schillings at [facility] in [place].  DISCHARGE DIAGNOSES: 1. Chronic obstructive pulmonary disease exacerbation.  2. Generalized weakness. 3. Chronic kidney disease stage III. 4. Coronary artery disease, status post stent. 5. Lung nodules.  HISTORY AND PRESENTATION: A 81 year old male with known past medical history of multiple medical problems like chronic obstructive pulmonary disease, hypertension, coronary artery disease and stent, prostate cancer, and chronic renal failure. He for the last few weeks feeling weaker and for last 2 to 3 days started having excessive shortness of breath and dry cough which sometimes little sputum production. Denied any fever or chest pain associated with it and denied any sick contacts. In ER he was given bronchodilators, but there was no improvement so he was admitted on the medical service for further management of his chronic obstructive pulmonary disease exacerbation.  HOSPITAL STAY: For his chronic obstructive pulmonary disease exacerbation, he was started on IV steroid and inhaled bronchodilator and Levaquin. He had gradual improvement in his condition, and so he was started on oral steroid on 3rd day, and he continued to improve his breathing function but he remained weak. For his weakness we did physical therapy consult, and they recommend either rehab or home with home health aide. With the discussion with family members, Care Management came to conclusion to send him home to his daughter's place with a home visiting nurse and physical therapy and with prescription of rolling walker, and that we arranged on last day. Other medical issues we addressed during this hospital stay were right lung nodule which is a known medical fact to him and his family members, and he is following to his primary  medical care and pulmonologist at [facility], so we would leave to follow up with them. Coronary artery disease, status post stent. His condition remained stable during the hospital stay, and we continued his home medications. We got echocardiogram for his chronic worsening of weakness and functional status and having significant cardiac history which was normal left ventricular ejection fraction and systolic function and wall motion so there were no cardiac issue or any cardiac symptoms during this admission. Chronic kidney disease stage III remained stable during the hospital stay, and we followed his creatinine level during the hospital stay which remained stable.   CONDITION ON DISCHARGE: Satisfactory.   CODE STATUS: FULL CODE.   MEDICATIONS ON DISCHARGE:  1. Thiamine 250 mg oral tablet once a day.  2. Nitroglycerin 0.4 mg sublingual every 5 minutes 3 doses as needed for chest pain.  3. Aspirin 81 mg 2 tabs once a day. 4. Atorvastatin 80 mg once a day. 5. Ranitidine 150 mg 2 times a day. 6. Percocet 1 tablet orally once a day at bedtime for pain.  7. Acetaminophen, aspirin, and caffeine 250/250/65 mg tablet once a day for 5 days as needed. 8. Prednisone 10 mg tablet packet, start with 60 mg daily and taper up to 10 mg daily, every day decreasing 10 mg. 9. Budesonide formoterol 160 mcg/4.5 mcg inhalation 1 puff inhaled 2 times a day 30 days.   Tiotropium 18 mcg inhalation capsule 1 capsule once a day. 10. Levofloxacin 250 mg oral tablet to be taken for 4 days to complete the course of antibiotics. 11. We advised him to stop taking his blood pressure medications like metoprolol, hydrochlorothiazide, and lisinopril as  his blood pressure was remaining in the range of 100 to 110 systolic blood pressure, and he has chronic obstructive pulmonary disease exacerbation so even though he is a cardiac history patient, but we recommend not to take any beta blockers.   Home health services like physical  therapy and visiting nurse were arranged by Care Manager on discharge. Home oxygen, no.   DIET: Regular in consistency regular.   ACTIVITY: As tolerated and recommended by Physical Therapy, and he was advised to follow up within 1 to 2 weeks with his primary doctor. Was [facility] at [facility] [place]. We also advised on discharge for him to get screening colonoscopy as he does not have as per discussion with family members since long time, and he needs regular followup for his lung nodules as he is already having it.      TOTAL TIME SPENT IN DISCHARGE AND DICTATING DISCHARGE SUMMARY:  55 minutes.      ____________________________ Hope PigeonVaibhavkumar G. Elisabeth PigeonVachhani, MD vgv:vtd D: 06/24/2012 17:15:47 ET T: 06/26/2012 14:58:10 ET JOB#: [id number]  cc: Hope PigeonVaibhavkumar G. Elisabeth PigeonVachhani, MD, <Dictator> Mickie HillierJack H. Sheppard PentonWolf, MD Altamese DillingVAIBHAVKUMAR Dmitri Pettigrew MD ELECTRONICALLY SIGNED 07/23/2012 18:03
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
## 2015-01-22 NOTE — Discharge Summary (Signed)
PATIENT NAME:  Karl Mills, Karl Mills#:  [id number] DATE OF BIRTH:  1932/08/04  DATE OF ADMISSION:  09/01/2013 DATE OF DISCHARGE:  09/04/2013  ADMITTING DIAGNOSES: Bacterial pneumonia.  DISCHARGE DIAGNOSES:  1.  Acute-on-chronic respiratory failure.  2.  Chronic respiratory failure, oxygen dependent at home, on 2 liters of oxygen per nasal cannula. Chronic obstructive pulmonary disease  exacerbation.   3.  Left lower lobe bacterial pneumonia due to Streptococcus pneumoniae  4.  Pleuritic chest pain due to pneumonia.  5.  Systemic inflammatory response reaction due to pneumonia.  6.  Anemia.  7.  Arrhythmias not otherwise specified, just frequent premature ventricular contractions. 8.  Hypomagnesemia. 9.  History of atrial fibrillation.  10.  Hypertension.  11.  Coronary artery disease.  12.  Gastroesophageal reflux disease.  13.  Chronic kidney disease, stage III. 14.  Prostate carcinoma.  15.  Dementia. 16.  Tobacco abuse, ongoing.   DISCHARGE CONDITION: Stable.   DISCHARGE MEDICATIONS:  The patient is to continue: 1.  Ranitidine 150 mg p.o. twice daily.  2.  Atorvastatin 80 mg p.o. at bedtime.  3.  Symbicort 160/4.5 mg one puff twice daily.  4.  Nitroglycerin 0.4 mg sublingually every 5 minutes as needed.  5.  Spiriva 1 capsule inhalation once daily.  6.  Sertraline 50 mg p.o. daily.  7.  Cardizem CD 120 mg p.o. daily.  8.  Prednisone 50 mg p.o. once on the 5th of December 2014, then taper by 10 mg every two days until stopped.  9.  Zithromax 500 mg p.o. once daily for 3 more days.  10.  Aspirin 81 mg p.o. once daily.  11.  Magnesium oxide 400 mg p.o. twice daily.  12.  Neupogen oral inhaler 1 inhalation every one to 2 hours as needed.  13.  Nicotine transdermal patch 14 mg transdermally daily.  14.  dextromethorphan guaifenesin 10 mg/100 mg in 5 mL oral liquid, 5 mL every four hours as needed.  15.  Combivent Respimat 1 puff four times daily as needed.   HOME HEALTH: With  physical therapy. The patient is to resume physical therapy.   HOME OXYGEN: At 3 liters of oxygen through nasal cannula.   DIET: Two grams salt, low fat, low cholesterol, regular consistency.   ACTIVITY LIMITATIONS: As tolerated.   FOLLOWUP APPOINTMENT: With Dr. Sheppard PentonWolf  in 2 days after discharge.   CONSULTANTS: Care management and social work.   RADIOLOGIC STUDIES: Chest x-ray, portable, single view, 1st of December 2014, revealed a left lung base pneumonia. Repeated chest x-ray, PA and lateral, 4th of December 2014, revealed mild CHF but no airspace consolidation was identified.   The patient is a 81 year old Caucasian male with history of ongoing tobacco abuse, history of COPD, who presents to the hospital with complaints of pleuritic chest pain on the 1st of December 2014. Please refer to Dr. Mordecai MaesSanchez Gutierrez's admission note on the 1st of December 2014.   PHYSICAL EXAMINATION:  VITAL SIGNS ON ARRIVAL TO THE HOSPITAL: Blood pressure was 124/63. Pulse was 92, respiration rate was 18 to 32. Temperature was 98.7.  LUNGS: Rales in the left lung as well as positive use of accessory muscles.   LABORATORIES: Done on arrival the 1st of December 2014, revealed glucose of 111. Beta natriuretic peptide was 935, BUN and creatinine were 32 and 1.87. Creatinine was elevated at 11.1. The patient's liver enzymes were normal. Cardiac enzymes x3 were within normal limits. TSH was normal at 1.75. White blood cell count  was markedly elevated to 19.3. Hemoglobin was 12.6, platelet count was 178. Coagulation panel was within normal limits.   Blood culture x1 did not show any growth. Sputum culture taken on the 1st of December 2014, revealed heavy growth of Streptococcus pneumoniae sensitive to all antibiotics. The patient was initiated on Rocephin and Zithromax, and his condition improved as well as white blood cell count normalized to 8.7 by the 3rd of December 2014. His chest pain resolved and his lungs sounded  much better with good air entrance.   Since he was rehydrated, his kidney function which was somewhat more impaired than usual, also improved. On the day of discharge, the patient's creatinine was 1.44. However, repeat chest x-ray revealed some congestive heart failure signs. It was felt that the patient was just overloaded with fluids which he received during his hospitalization. We gave him IV fluids due to his elevated creatinine to 1.87. The patient was given 1 dose of IV Lasix after which he diuresed some, put out around 240 mL of fluid, and his oxygenation overall improved.   On the day of discharge, 4th of December 2014, the patient's vital signs are stable with temperature of 97.8. Pulse was in 80s to 90s. Respiration rate was 20, blood pressure 127/62. Saturation was 97% on 3 liters of oxygen per nasal cannula. The patient is to continue antibiotics as well as steroid taper as well as inhalation therapy for his COPD exacerbation due to pneumonia. He is to follow up with his primary physician, Dr. Sheppard Penton, for further recommendations.   He was evaluated by a physical therapist while he was in the hospital and recommended home health physical therapy.   In regards to left lower lobe pneumonia, it was felt that the patient's pneumonia was due to bacterial infection with Streptococcus pneumoniae. As mentioned above, the patient responded well to antibiotic therapy. He is to continue antibiotics for a few more days to complete course.   In regards to pleuritic chest pain, it was felt to be due to pneumonia. It also resolved with therapy of his pneumonia.   In regards to systemic inflammatory response reaction, the patient's systemic inflammatory response reaction was also subsided and blood culture x1 did not show any growth. The patient was noted to have some arrhythmias, PVCs, which were felt to be due to hypomagnesemia as well as possibly hypoxia and stress. He is given magnesium supplements,  hypomagnesemia has resolved.   In regards to his chronic medical problems such as Afib with COPD, hypertension, coronary artery disease, and CKD, he is to continue his outpatient management.   For history of tobacco abuse, we counseled the patient and recommended nicotine replacement therapy.   DISCHARGE CONDITION: Stable condition with the above-mentioned medications and follow-up.   TIME SPENT: 40 minutes on this patient.   ____________________________ Katharina Caper, MD rv:np D: 09/04/2013 18:55:50 ET T: 09/04/2013 22:11:22 ET JOB#: [id number]  cc: Katharina Caper, MD, <Dictator> Mickie Hillier. Sheppard Penton, MD  Katharina Caper MD ELECTRONICALLY SIGNED 09/10/2013 16:32

## 2015-01-23 NOTE — H&P (Signed)
PATIENT NAME:  Karl Mills, Karl Mills MR#:  [id number] DATE OF BIRTH:  1932-07-11  DATE OF ADMISSION:  10/28/2013  PRIMARY CARE PHYSICIAN:  At [facility].   CHIEF COMPLAINT: Chest pain and shortness of breath.   HISTORY OF PRESENT ILLNESS: This is a 81 year old man who came in with chest pain in the left lung area radiating all around his chest, not too severe. Currently, does not have any chest pain. He also had shortness of breath and was working to breathe when he came in, and he had wheezing. Currently, he is feeling better. In the ER, he was tachycardic and had an elevated white count. His chest x-ray was negative and flu swab was negative. Hospitalist services were contacted for further evaluation. The patient is not the best historian secondary to dementia. Son at bedside able to give more information.   PAST MEDICAL HISTORY: Chronic obstructive pulmonary disease, prostate cancer, coronary artery disease with stent, punctured lung in the past, fracture of the neck in the past, lung nodule that is being followed as outpatient, chronic kidney disease and dementia.   PAST SURGICAL HISTORY: Appendectomy, prostate freezing, left knee surgery.   ALLERGIES: SULFA AND PENICILLIN.   SOCIAL HISTORY: Smokes 1 pack per day. No alcohol. No drug use. Used to work in [employer], fashion/clothing.   FAMILY HISTORY: Father with an MI. Mother died at 47, unknown cause.   MEDICATIONS: Include albuterol nebulizer solution q.6 hours, Aricept 10 mg at bedtime, aspirin 81 mg daily, atorvastatin 80 mg at bedtime, diltiazem 300 mg extended release daily, lorazepam 0.5 mg twice a day, nitroglycerin 0.4 mg every 5 minutes as needed for chest pain, ProAir CFC 2 puffs every 6 hours, ranitidine 150 mg twice a day, Zoloft 150 mg daily, Spiriva 1 inhalation daily, Symbicort 160/4.5, 2 puffs twice a day.     REVIEW OF SYSTEMS:   CONSTITUTIONAL: No fever, chills or sweats. No weight gain. No weight loss. No weakness.  EYES: No blurry  vision.  EARS, NOSE, MOUTH AND THROAT: No hearing loss. No sore throat. No difficulty swallowing.  CARDIOVASCULAR: Positive for chest pain. No palpitations.  RESPIRATORY: Positive for shortness of breath. Positive for cough. No sputum. No hemoptysis.  GASTROINTESTINAL: No nausea. No vomiting. Positive for abdominal pain. No diarrhea. No constipation. No bright red blood per rectum. No melena.   GENITOURINARY: No burning on urination. No hematuria.  MUSCULOSKELETAL: Positive for joint pain. No muscle pain.  INTEGUMENT: No rashes or eruptions.  NEUROLOGIC: No fainting or blackouts.  PSYCHIATRIC: No anxiety or depression.  ENDOCRINE: No thyroid problems.  HEMATOLOGIC AND LYMPHATIC: No anemia.   PHYSICAL EXAMINATION: VITAL SIGNS: Temperature 98.7, pulse 112, respirations 22, blood pressure 137/67, pulse ox 91% on room air.  GENERAL: No respiratory distress.  EYES: Conjunctivae and lids normal. Pupils equal, round and reactive to light. Extraocular muscles intact. No nystagmus.  EARS, NOSE, MOUTH AND THROAT: Tympanic membranes: No erythema. Nasal mucosa: No erythema. Throat: No erythema. No exudate seen. Lips and gums: No lesions.  NECK: No JVD. No bruits. No lymphadenopathy. No thyromegaly. No thyroid nodules palpated.  RESPIRATORY: Decreased breath sounds bilaterally. Positive expiratory wheeze. No use of accessory muscles to breathe.  CARDIOVASCULAR: S1 and S2, tachycardic. No gallops, rubs or murmurs heard. Carotid upstroke 2+ bilaterally. No bruits. Dorsalis pedal pulses 2+ bilaterally. Trace edema of the lower extremity.  ABDOMEN: Soft, nontender. No organomegaly/splenomegaly. Normoactive bowel sounds. No masses felt.  LYMPHATIC: No lymph nodes in the neck.  MUSCULOSKELETAL: Trace edema. No  clubbing. No cyanosis.  SKIN: No ulcers or lesions seen.  NEUROLOGIC: Cranial nerves II through XII grossly intact. Deep tendon reflexes 1+ bilateral lower extremities.  PSYCHIATRIC: The patient is  alert, oriented to person and place.   LABORATORY AND RADIOLOGICAL DATA: BNP 365. Troponin negative. Glucose 111, BUN 29, creatinine 1.99, sodium 136, potassium 3.7, chloride 106, CO2 of 25, calcium 10.0. Liver function tests normal range. White blood cell count 14.2, H and H 11.8 and 35.1, platelet count of 163. Chest x-ray: Emphysema with chronic scarring. Influenza negative. Urinalysis negative.   EKG: Reviewed. No acute ST-T wave changes.   ASSESSMENT AND PLAN: 1.  Chronic obstructive pulmonary disease exacerbation. We will give IV Solu-Medrol 40 mg IV q.6 hours. Continue Spiriva and Symbicort. Will give DuoNeb nebulizer solution. The ER stopped IV Levaquin secondary to itching on the hands. We will give doxycycline.  2.  Systemic inflammatory response syndrome. This is probably secondary to COPD exacerbation.  We will continue to monitor vital signs.  3.  Acute renal failure on chronic kidney disease. We will give gentle IV fluid hydration 1 liter and check a BMP in the a.m.  4.  History of coronary artery disease with chest pain. First enzyme is negative. We will observe on off unit telemetry and get serial cardiac enzymes and continue aspirin.  5.  Dementia. Continue Aricept.  6.  Tobacco abuse. Smoking cessation counseling done 3 minutes by me. The patient refused nicotine patch.   TIME SPENT ON ADMISSION: 55 minutes.   The patient is a FULL CODE.    ____________________________ Herschell Dimesichard J. Renae GlossWieting, MD rjw:dmm D: 10/28/2013 19:31:12 ET T: 10/28/2013 20:02:17 ET JOB#: [id number]  cc: Herschell Dimesichard J. Renae GlossWieting, MD, <Dictator> [facility] [place] Salley ScarletICHARD J Bijon Mineer MD ELECTRONICALLY SIGNED 10/31/2013 15:23

## 2015-01-23 NOTE — Discharge Summary (Signed)
PATIENT NAME:  Karl Mills, Karl Mills MR#:  [id number] DATE OF BIRTH:  Feb 10, 1932  DATE OF ADMISSION:  10/28/2013 DATE OF DISCHARGE:  10/29/2013  PRESENTING COMPLAINT: Shortness of breath and cough.   DISCHARGE DIAGNOSES:  1. Acute on chronic respiratory failure due to chronic obstructive pulmonary disease exacerbation.  2. Chronic emphysema with chronic ongoing tobacco abuse.   CODE STATUS: Full code.   MEDICATIONS:  1. Ranitidine 150 mg p.o. b.i.d.  2. Atorvastatin 80 mg at bedtime.  3. Nitroglycerin 0.4 mg sublingual as needed.  4. Spiriva 18 mcg inhalation daily.  5. Aspirin 81 mg daily.  6. Diltiazem 300 mg 1 capsule once a day.  7. Sertraline 100 mg p.o. daily.  8. Symbicort 160/4.5 two puffs twice a day.  9. Aricept 5 mg p.o. at bedtime.  10. Lorazepam 0.5 mg 1 tablet twice a day as needed.  11. ProAir HFA 2 puffs every 4 to 6 hours.  12. Albuterol nebulizer every 6 hours as needed.  13. Prednisone taper.  14. Doxycycline 100 mg b.i.d.   OXYGEN: 2 liter nasal cannula oxygen at bedtime. The patient is already on it.   DIET: Regular. Low salt diet.   FOLLOWUP: [facility] in 1 to 2 weeks.   DIAGNOSTIC STUDIES: WBC is 13.0, H and H is 11.6 and 34.4, platelet count is 160, MCV is 88. Glucose is 267, BUN is 31, creatinine 1.93, sodium is 134, potassium is 4, chloride is 104, bicarbonate is 23, calcium is 10.9. Cardiac enzymes x3 negative. UA negative for UTI. Influenza A plus B negative. Blood cultures negative in 18 to 24 hours. Chest x-ray consistent with emphysema and chronic scarring.  BRIEF SUMMARY OF HOSPITAL COURSE: Mr. Wilhelmenia BlaseFreeze is a 81 year old Caucasian gentleman who has history of chronic emphysema, chronic respiratory failure, on home oxygen during nighttime mainly, who comes in with:   1. Acute on chronic respiratory failure secondary to chronic obstructive pulmonary disease exacerbation. He was started on high dose of IV  Solu-Medrol, which was changed to p.o. prednisone once the patient started feeling better. His DuoNebs were continued along with Spiriva and Symbicort. He was kept on p.o. doxycycline. Chest x-ray did not show any evidence of pneumonia, and blood cultures remained negative.  2. Systemic inflammatory response syndrome secondary to chronic obstructive pulmonary disease exacerbation.  3. Acute on chronic renal failure. The patient appears well hydrated.  4. History of coronary artery disease with chest pain. The patient was continued aspirin. His cardiac enzymes x3 were negative. His chest tightness was likely due to chronic obstructive pulmonary disease flare.  5. Dementia, on Aricept.  6. Tobacco abuse. Smoking cessation was counseled. The patient is not interested in smoking cessation at this time.   Hospital stay otherwise remained stable.   CODE STATUS: The patient remained a full code.   TIME SPENT: 40 minutes.  ____________________________ Wylie HailSona A. Allena KatzPatel, MD sap:lb D: 10/30/2013 06:38:12 ET T: 10/30/2013 07:04:41 ET JOB#: [id number]  cc: Tamya Denardo A. Allena KatzPatel, MD, <Dictator> Willow OraSONA A Elianny Buxbaum MD ELECTRONICALLY SIGNED 10/30/2013 16:07

## 2015-03-13 ENCOUNTER — Encounter: Payer: Self-pay | Admitting: Emergency Medicine

## 2015-03-13 ENCOUNTER — Emergency Department: Payer: Medicare Other

## 2015-03-13 ENCOUNTER — Emergency Department
Admission: EM | Admit: 2015-03-13 | Discharge: 2015-03-13 | Disposition: A | Discharge status: Home / Self Care | Payer: Medicare Other | Attending: Emergency Medicine | Admitting: Emergency Medicine

## 2015-03-13 DIAGNOSIS — S0990XA Unspecified injury of head, initial encounter: Secondary | ICD-10-CM | POA: Diagnosis present

## 2015-03-13 DIAGNOSIS — W1839XA Other fall on same level, initial encounter: Secondary | ICD-10-CM | POA: Insufficient documentation

## 2015-03-13 DIAGNOSIS — Y998 Other external cause status: Secondary | ICD-10-CM | POA: Diagnosis not present

## 2015-03-13 DIAGNOSIS — Y9389 Activity, other specified: Secondary | ICD-10-CM | POA: Diagnosis not present

## 2015-03-13 DIAGNOSIS — W19XXXA Unspecified fall, initial encounter: Secondary | ICD-10-CM

## 2015-03-13 DIAGNOSIS — S0011XA Contusion of right eyelid and periocular area, initial encounter: Secondary | ICD-10-CM | POA: Insufficient documentation

## 2015-03-13 DIAGNOSIS — Y92129 Unspecified place in nursing home as the place of occurrence of the external cause: Secondary | ICD-10-CM | POA: Insufficient documentation

## 2015-03-13 DIAGNOSIS — S0003XA Contusion of scalp, initial encounter: Secondary | ICD-10-CM | POA: Insufficient documentation

## 2015-03-13 DIAGNOSIS — T148XXA Other injury of unspecified body region, initial encounter: Secondary | ICD-10-CM

## 2015-03-13 HISTORY — DX: Major depressive disorder, single episode, unspecified: F32.9

## 2015-03-13 HISTORY — DX: Gastro-esophageal reflux disease without esophagitis: K21.9

## 2015-03-13 HISTORY — DX: Unspecified dementia, unspecified severity, without behavioral disturbance, psychotic disturbance, mood disturbance, and anxiety: F03.90

## 2015-03-13 HISTORY — DX: Atherosclerotic heart disease of native coronary artery without angina pectoris: I25.10

## 2015-03-13 HISTORY — DX: Edema, unspecified: R60.9

## 2015-03-13 HISTORY — DX: Insomnia, unspecified: G47.00

## 2015-03-13 HISTORY — DX: Chronic pain syndrome: G89.4

## 2015-03-13 HISTORY — DX: Unspecified atrial fibrillation: I48.91

## 2015-03-13 HISTORY — DX: Chronic obstructive pulmonary disease, unspecified: J44.9

## 2015-03-13 NOTE — ED Notes (Signed)
Per EMS pt had an unwitnessed fall at some point last night, pt and staff unable to tell when, pt was found on the floor this morning and placed back in bed prior to EMS arrival.Laceration noted to R eyebrow, as well as bruising, bleeding controlled at this time. EMS states pt was guarding on L side. Per EMS it is questionable as to whether or not pt is at baseline, pt will grunt and moan in response to questions.

## 2015-03-13 NOTE — ED Provider Notes (Signed)
[facility] Emergency Department Provider Note     Time seen: ----------------------------------------- 8:58 AM on 03/13/2015 -----------------------------------------    I have reviewed the triage vital signs and the nursing notes.   HISTORY  Chief Complaint No chief complaint on file.  Level V caveat, review systems and history is unable to be obtained from patient due to severe dementia  HPI Karl Mills is a 81 y.o. male brought from the nursing home for fall.Fall was unwitnessed, he was found on the floor of his room. He cannot give review of systems or report due to dementia. He is DO NOT RESUSCITATE.   No past medical history on file.  There are no active problems to display for this patient.   No past surgical history on file.  Allergies Review of patient's allergies indicates not on file.  Social History History  Substance Use Topics  . Smoking status: Not on file  . Smokeless tobacco: Not on file  . Alcohol Use: Not on file    Review of Systems Patient unable to give review of systems, it is unknown  10-point ROS otherwise negative.  ____________________________________________   PHYSICAL EXAM:  VITAL SIGNS: ED Triage Vitals  Enc Vitals Group     BP --      Pulse --      Resp --      Temp --      Temp src --      SpO2 --      Weight --      Height --      Head Cir --      Peak Flow --      Pain Score --      Pain Loc --      Pain Edu? --      Excl. in GC? --     Constitutional: Alert but disoriented, cachexia Eyes: Conjunctivae are normal. PERRL.  ENT   Head: Right frontal scalp and right temporal abrasion and contusion   Nose: No congestion/rhinnorhea.   Mouth/Throat: Mucous membranes are moist.   Neck: No stridor. Cardiovascular: Normal rate, regular rhythm. Respiratory: Normal respiratory effort without tachypnea nor retractions. Breath sounds are clear and equal  bilaterally. Gastrointestinal: Soft and nontender. No distention. No abdominal bruits. There is no CVA tenderness. Musculoskeletal: Patient with limited range of motion of all the extremities, denies pain to his extremities Neurologic:  Patient disoriented, but cooperative. Does not follow commands Skin:  Scattered abrasions and contusions are noted  ____________________________________________  ED COURSE:  Pertinent labs & imaging results that were available during my care of the patient were reviewed by me and considered in my medical decision making (see chart for details). Patient may need imaging for fall, will discuss with family ____________________________________________   RADIOLOGY  Chest x-ray, pelvis x-ray, CT head IMPRESSION: No acute or traumatic intracranial finding. No skull fracture. Chronic atrophic and small-vessel ischemic changes. Mild soft tissue swelling right forehead.  IMPRESSION: Advanced emphysema with diffuse lung scarring and no evidence of lobar pneumonia.  Nodular opacity at the lung base on the lateral view, more conspicuous than the prior. This may reflect chronic lung changes, however, pulmonary nodule could have this appearance. Correlation with any outside CT imaging of the chest may be useful, or alternatively referral for pulmonary evaluation and chest CT.  Atherosclerosis and coronary artery disease.   IMPRESSION: No acute osseous abnormality identified. ____________________________________________  FINAL ASSESSMENT AND PLAN  Unwitnessed fall at nursing home, abrasion and contusion  Plan: I discussed with the son who is the emergency contact to did not wish to have a significant medical workup at this time. Was more interested in any traumatic injuries. X-rays performed as above. Did note patient was recently placed on nursing home for frequent falls. He is stable for outpatient follow-up. Emily Filbert, MD   Emily Filbert, MD 03/13/15 1019

## 2015-03-13 NOTE — ED Notes (Signed)
Pt returned from X Ray.

## 2015-03-13 NOTE — ED Notes (Signed)
Spoke with Leota Jacobsen, RN to give report.

## 2015-03-13 NOTE — Discharge Instructions (Signed)
Abrasions °An abrasion is a cut or scrape of the skin. Abrasions do not go through all layers of the skin. °HOME CARE °· If a bandage (dressing) was put on your wound, change it as told by your doctor. If the bandage sticks, soak it off with warm. °· Wash the area with water and soap 2 times a day. Rinse off the soap. Pat the area dry with a clean towel. °· Put on medicated cream (ointment) as told by your doctor. °· Change your bandage right away if it gets wet or dirty. °· Only take medicine as told by your doctor. °· See your doctor within 24-48 hours to get your wound checked. °· Check your wound for redness, puffiness (swelling), or yellowish-white fluid (pus). °GET HELP RIGHT AWAY IF:  °· You have more pain in the wound. °· You have redness, swelling, or tenderness around the wound. °· You have pus coming from the wound. °· You have a fever or lasting symptoms for more than 2-3 days. °· You have a fever and your symptoms suddenly get worse. °· You have a bad smell coming from the wound or bandage. °MAKE SURE YOU:  °· Understand these instructions. °· Will watch your condition. °· Will get help right away if you are not doing well or get worse. °Document Released: 03/06/2008 Document Revised: 06/12/2012 Document Reviewed: 08/22/2011 °[employer]® Patient Information ©2015 [employer]. This information is not intended to replace advice given to you by your health care provider. Make sure you discuss any questions you have with your health care provider. ° °Head Injury °You have received a head injury. It does not appear serious at this time. Headaches and vomiting are common following head injury. It should be easy to awaken from sleeping. Sometimes it is necessary for you to stay in the emergency department for a while for observation. Sometimes admission to the hospital may be needed. After injuries such as yours, most problems occur within the first 24 hours, but side effects may occur up to 7-10 days after  the injury. It is important for you to carefully monitor your condition and contact your health care provider or seek immediate medical care if there is a change in your condition. °WHAT ARE THE TYPES OF HEAD INJURIES? °Head injuries can be as minor as a bump. Some head injuries can be more severe. More severe head injuries include: °· A jarring injury to the brain (concussion). °· A bruise of the brain (contusion). This mean there is bleeding in the brain that can cause swelling. °· A cracked skull (skull fracture). °· Bleeding in the brain that collects, clots, and forms a bump (hematoma). °WHAT CAUSES A HEAD INJURY? °A serious head injury is most likely to happen to someone who is in a car wreck and is not wearing a seat belt. Other causes of major head injuries include bicycle or motorcycle accidents, sports injuries, and falls. °HOW ARE HEAD INJURIES DIAGNOSED? °A complete history of the event leading to the injury and your current symptoms will be helpful in diagnosing head injuries. Many times, pictures of the brain, such as CT or MRI are needed to see the extent of the injury. Often, an overnight hospital stay is necessary for observation.  °WHEN SHOULD I SEEK IMMEDIATE MEDICAL CARE?  °You should get help right away if: °· You have confusion or drowsiness. °· You feel sick to your stomach (nauseous) or have continued, forceful vomiting. °· You have dizziness or unsteadiness that is getting worse. °·   You have severe, continued headaches not relieved by medicine. Only take over-the-counter or prescription medicines for pain, fever, or discomfort as directed by your health care provider.  You do not have normal function of the arms or legs or are unable to walk.  You notice changes in the black spots in the center of the colored part of your eye (pupil).  You have a clear or bloody fluid coming from your nose or ears.  You have a loss of vision. During the next 24 hours after the injury, you must stay  with someone who can watch you for the warning signs. This person should contact local emergency services (911 in the [place] if you have seizures, you become unconscious, or you are unable to wake up. HOW CAN I PREVENT A HEAD INJURY IN THE FUTURE? The most important factor for preventing major head injuries is avoiding motor vehicle accidents. To minimize the potential for damage to your head, it is crucial to wear seat belts while riding in motor vehicles. Wearing helmets while bike riding and playing collision sports (like football) is also helpful. Also, avoiding dangerous activities around the house will further help reduce your risk of head injury.  WHEN CAN I RETURN TO NORMAL ACTIVITIES AND ATHLETICS? You should be reevaluated by your health care provider before returning to these activities. If you have any of the following symptoms, you should not return to activities or contact sports until 1 week after the symptoms have stopped:  Persistent headache.  Dizziness or vertigo.  Poor attention and concentration.  Confusion.  Memory problems.  Nausea or vomiting.  Fatigue or tire easily.  Irritability.  Intolerant of bright lights or loud noises.  Anxiety or depression.  Disturbed sleep. MAKE SURE YOU:   Understand these instructions.  Will watch your condition.  Will get help right away if you are not doing well or get worse. Document Released: 09/18/2005 Document Revised: 09/23/2013 Document Reviewed: 05/26/2013 [facility] Patient Information 2015 [place], [place]. This information is not intended to replace advice given to you by your health care provider. Make sure you discuss any questions you have with your health care provider.

## 2015-06-19 ENCOUNTER — Emergency Department: Payer: Medicare Other

## 2015-06-19 ENCOUNTER — Encounter: Payer: Self-pay | Admitting: Emergency Medicine

## 2015-06-19 ENCOUNTER — Inpatient Hospital Stay
Admission: EM | Admit: 2015-06-19 | Discharge: 2015-06-22 | DRG: 195 | Disposition: A | Discharge status: Skilled Nursing Facility | Payer: Medicare Other | Attending: Internal Medicine | Admitting: Internal Medicine

## 2015-06-19 DIAGNOSIS — K5900 Constipation, unspecified: Secondary | ICD-10-CM | POA: Diagnosis present

## 2015-06-19 DIAGNOSIS — Z8249 Family history of ischemic heart disease and other diseases of the circulatory system: Secondary | ICD-10-CM

## 2015-06-19 DIAGNOSIS — F0390 Unspecified dementia without behavioral disturbance: Secondary | ICD-10-CM | POA: Diagnosis present

## 2015-06-19 DIAGNOSIS — I2510 Atherosclerotic heart disease of native coronary artery without angina pectoris: Secondary | ICD-10-CM | POA: Diagnosis present

## 2015-06-19 DIAGNOSIS — J189 Pneumonia, unspecified organism: Principal | ICD-10-CM | POA: Diagnosis present

## 2015-06-19 DIAGNOSIS — Z886 Allergy status to analgesic agent status: Secondary | ICD-10-CM | POA: Diagnosis not present

## 2015-06-19 DIAGNOSIS — E875 Hyperkalemia: Secondary | ICD-10-CM | POA: Diagnosis present

## 2015-06-19 DIAGNOSIS — Z79899 Other long term (current) drug therapy: Secondary | ICD-10-CM

## 2015-06-19 DIAGNOSIS — D649 Anemia, unspecified: Secondary | ICD-10-CM | POA: Diagnosis present

## 2015-06-19 DIAGNOSIS — J449 Chronic obstructive pulmonary disease, unspecified: Secondary | ICD-10-CM | POA: Diagnosis present

## 2015-06-19 DIAGNOSIS — K921 Melena: Secondary | ICD-10-CM | POA: Diagnosis not present

## 2015-06-19 DIAGNOSIS — K869 Disease of pancreas, unspecified: Secondary | ICD-10-CM | POA: Diagnosis present

## 2015-06-19 DIAGNOSIS — K219 Gastro-esophageal reflux disease without esophagitis: Secondary | ICD-10-CM | POA: Diagnosis present

## 2015-06-19 DIAGNOSIS — G894 Chronic pain syndrome: Secondary | ICD-10-CM | POA: Diagnosis present

## 2015-06-19 DIAGNOSIS — E860 Dehydration: Secondary | ICD-10-CM

## 2015-06-19 DIAGNOSIS — R4182 Altered mental status, unspecified: Secondary | ICD-10-CM

## 2015-06-19 DIAGNOSIS — I4891 Unspecified atrial fibrillation: Secondary | ICD-10-CM | POA: Diagnosis present

## 2015-06-19 DIAGNOSIS — Z882 Allergy status to sulfonamides status: Secondary | ICD-10-CM | POA: Diagnosis not present

## 2015-06-19 DIAGNOSIS — Z880 Allergy status to penicillin: Secondary | ICD-10-CM | POA: Diagnosis not present

## 2015-06-19 DIAGNOSIS — Z66 Do not resuscitate: Secondary | ICD-10-CM | POA: Diagnosis present

## 2015-06-19 DIAGNOSIS — G4700 Insomnia, unspecified: Secondary | ICD-10-CM | POA: Diagnosis present

## 2015-06-19 DIAGNOSIS — I5033 Acute on chronic diastolic (congestive) heart failure: Secondary | ICD-10-CM

## 2015-06-19 LAB — COMPREHENSIVE METABOLIC PANEL
ALT: 11 U/L — AB (ref 17–63)
AST: 18 U/L (ref 15–41)
Albumin: 3.1 g/dL — ABNORMAL LOW (ref 3.5–5.0)
Alkaline Phosphatase: 59 U/L (ref 38–126)
Anion gap: 5 (ref 5–15)
BUN: 54 mg/dL — ABNORMAL HIGH (ref 6–20)
CO2: 31 mmol/L (ref 22–32)
CREATININE: 1.43 mg/dL — AB (ref 0.61–1.24)
Calcium: 10.3 mg/dL (ref 8.9–10.3)
Chloride: 107 mmol/L (ref 101–111)
GFR, EST AFRICAN AMERICAN: 51 mL/min — AB (ref >60)
GFR, EST NON AFRICAN AMERICAN: 44 mL/min — AB (ref >60)
Glucose, Bld: 101 mg/dL — ABNORMAL HIGH (ref 65–99)
Potassium: 5.5 mmol/L — ABNORMAL HIGH (ref 3.5–5.1)
Sodium: 143 mmol/L (ref 135–145)
Total Bilirubin: 0.7 mg/dL (ref 0.3–1.2)
Total Protein: 6.6 g/dL (ref 6.5–8.1)

## 2015-06-19 LAB — CBC WITH DIFFERENTIAL/PLATELET
BASOS ABS: 0 10*3/uL (ref 0–0.1)
Basophils Relative: 0 %
Eosinophils Absolute: 0 10*3/uL (ref 0–0.7)
Eosinophils Relative: 0 %
HEMATOCRIT: 26.7 % — AB (ref 40.0–52.0)
Hemoglobin: 8.8 g/dL — ABNORMAL LOW (ref 13.0–18.0)
LYMPHS ABS: 1.1 10*3/uL (ref 1.0–3.6)
LYMPHS PCT: 14 %
MCH: 29.7 pg (ref 26.0–34.0)
MCHC: 32.9 g/dL (ref 32.0–36.0)
MCV: 90.5 fL (ref 80.0–100.0)
MONO ABS: 1 10*3/uL (ref 0.2–1.0)
MONOS PCT: 12 %
NEUTROS ABS: 6.1 10*3/uL (ref 1.4–6.5)
Neutrophils Relative %: 74 %
Platelets: 157 10*3/uL (ref 150–440)
RBC: 2.95 MIL/uL — ABNORMAL LOW (ref 4.40–5.90)
RDW: 19 % — AB (ref 11.5–14.5)
WBC: 8.3 10*3/uL (ref 3.8–10.6)

## 2015-06-19 LAB — BRAIN NATRIURETIC PEPTIDE: B NATRIURETIC PEPTIDE 5: 294 pg/mL — AB (ref 0.0–100.0)

## 2015-06-19 LAB — TROPONIN I

## 2015-06-19 LAB — URINALYSIS COMPLETE WITH MICROSCOPIC (ARMC ONLY)
BACTERIA UA: NONE SEEN
Bilirubin Urine: NEGATIVE
GLUCOSE, UA: NEGATIVE mg/dL
HGB URINE DIPSTICK: NEGATIVE
Ketones, ur: NEGATIVE mg/dL
Nitrite: NEGATIVE
Protein, ur: 30 mg/dL — AB
SQUAMOUS EPITHELIAL / LPF: NONE SEEN
Specific Gravity, Urine: 1.018 (ref 1.005–1.030)
pH: 5 (ref 5.0–8.0)

## 2015-06-19 LAB — MRSA PCR SCREENING: MRSA by PCR: NEGATIVE

## 2015-06-19 MED ORDER — FOLIC ACID 0.5 MG HALF TAB
400.0000 ug | ORAL_TABLET | Freq: Every day | ORAL | Status: DC
  Administered 2015-06-20 – 2015-06-22 (×3): 0.5 mg via ORAL
  Filled 2015-06-19 (×5): qty 1

## 2015-06-19 MED ORDER — MAGNESIUM OXIDE 400 MG PO TABS
400.0000 mg | ORAL_TABLET | Freq: Every day | ORAL | Status: DC
  Administered 2015-06-20 – 2015-06-22 (×3): 400 mg via ORAL
  Filled 2015-06-19 (×7): qty 1

## 2015-06-19 MED ORDER — CHLORHEXIDINE GLUCONATE 0.12 % MT SOLN
15.0000 mL | Freq: Two times a day (BID) | OROMUCOSAL | Status: DC
  Administered 2015-06-19 – 2015-06-22 (×6): 15 mL via OROMUCOSAL
  Filled 2015-06-19 (×4): qty 15

## 2015-06-19 MED ORDER — FAMOTIDINE 20 MG PO TABS
10.0000 mg | ORAL_TABLET | Freq: Every day | ORAL | Status: DC
  Administered 2015-06-20 – 2015-06-22 (×3): 10 mg via ORAL
  Filled 2015-06-19 (×4): qty 1

## 2015-06-19 MED ORDER — GABAPENTIN 100 MG PO CAPS
100.0000 mg | ORAL_CAPSULE | Freq: Three times a day (TID) | ORAL | Status: DC
  Administered 2015-06-19 – 2015-06-22 (×8): 100 mg via ORAL
  Filled 2015-06-19 (×9): qty 1

## 2015-06-19 MED ORDER — FUROSEMIDE 20 MG PO TABS
20.0000 mg | ORAL_TABLET | Freq: Every day | ORAL | Status: DC
  Administered 2015-06-20 – 2015-06-22 (×3): 20 mg via ORAL
  Filled 2015-06-19 (×4): qty 1

## 2015-06-19 MED ORDER — POLYETHYLENE GLYCOL 3350 17 G PO PACK
17.0000 g | PACK | Freq: Every day | ORAL | Status: DC
  Administered 2015-06-20 – 2015-06-22 (×3): 17 g via ORAL
  Filled 2015-06-19 (×3): qty 1

## 2015-06-19 MED ORDER — AMITRIPTYLINE HCL 25 MG PO TABS
50.0000 mg | ORAL_TABLET | Freq: Every day | ORAL | Status: DC
  Administered 2015-06-19 – 2015-06-21 (×3): 50 mg via ORAL
  Filled 2015-06-19 (×3): qty 2

## 2015-06-19 MED ORDER — LORAZEPAM 2 MG/ML IJ SOLN
INTRAMUSCULAR | Status: AC
  Filled 2015-06-19: qty 1

## 2015-06-19 MED ORDER — MORPHINE SULFATE (CONCENTRATE) 20 MG/ML PO SOLN
10.0000 mg | ORAL | Status: DC | PRN
  Filled 2015-06-19: qty 0.5

## 2015-06-19 MED ORDER — CETYLPYRIDINIUM CHLORIDE 0.05 % MT LIQD
7.0000 mL | Freq: Two times a day (BID) | OROMUCOSAL | Status: DC
  Administered 2015-06-19 – 2015-06-22 (×4): 7 mL via OROMUCOSAL

## 2015-06-19 MED ORDER — DILTIAZEM HCL ER COATED BEADS 180 MG PO CP24
300.0000 mg | ORAL_CAPSULE | Freq: Every day | ORAL | Status: DC
  Administered 2015-06-20 – 2015-06-22 (×3): 300 mg via ORAL
  Filled 2015-06-19 (×4): qty 1

## 2015-06-19 MED ORDER — HALOPERIDOL 0.5 MG PO TABS
0.5000 mg | ORAL_TABLET | Freq: Two times a day (BID) | ORAL | Status: DC
  Administered 2015-06-19 – 2015-06-22 (×6): 0.5 mg via ORAL
  Filled 2015-06-19 (×9): qty 1

## 2015-06-19 MED ORDER — TIOTROPIUM BROMIDE MONOHYDRATE 18 MCG IN CAPS
18.0000 ug | ORAL_CAPSULE | Freq: Every day | RESPIRATORY_TRACT | Status: DC
  Administered 2015-06-20 – 2015-06-22 (×3): 18 ug via RESPIRATORY_TRACT
  Filled 2015-06-19: qty 5

## 2015-06-19 MED ORDER — LORAZEPAM 2 MG/ML IJ SOLN
1.0000 mg | Freq: Three times a day (TID) | INTRAMUSCULAR | Status: DC | PRN
  Administered 2015-06-19 – 2015-06-21 (×5): 1 mg via INTRAVENOUS
  Filled 2015-06-19 (×5): qty 1

## 2015-06-19 MED ORDER — ALBUTEROL SULFATE (2.5 MG/3ML) 0.083% IN NEBU: 3.0000 mL | INHALATION_SOLUTION | Freq: Four times a day (QID) | RESPIRATORY_TRACT | Status: DC | PRN

## 2015-06-19 MED ORDER — TRAZODONE HCL 100 MG PO TABS
100.0000 mg | ORAL_TABLET | Freq: Every day | ORAL | Status: DC
  Administered 2015-06-19 – 2015-06-21 (×3): 100 mg via ORAL
  Filled 2015-06-19 (×3): qty 1

## 2015-06-19 MED ORDER — SODIUM POLYSTYRENE SULFONATE 15 GM/60ML PO SUSP
30.0000 g | Freq: Once | ORAL | Status: AC
  Administered 2015-06-19: 30 g via ORAL
  Filled 2015-06-19: qty 120

## 2015-06-19 MED ORDER — AZITHROMYCIN 500 MG IV SOLR
250.0000 mg | INTRAVENOUS | Status: DC
  Administered 2015-06-19 – 2015-06-21 (×3): 250 mg via INTRAVENOUS
  Filled 2015-06-19 (×5): qty 250

## 2015-06-19 MED ORDER — NITROGLYCERIN 0.4 MG SL SUBL: 0.4000 mg | SUBLINGUAL_TABLET | SUBLINGUAL | Status: DC | PRN

## 2015-06-19 MED ORDER — IOHEXOL 300 MG/ML  SOLN
75.0000 mL | Freq: Once | INTRAMUSCULAR | Status: AC | PRN
  Administered 2015-06-19: 75 mL via INTRAVENOUS

## 2015-06-19 MED ORDER — DEXTROSE 5 % IV SOLN
1.0000 g | INTRAVENOUS | Status: DC
  Administered 2015-06-19 – 2015-06-22 (×4): 1 g via INTRAVENOUS
  Filled 2015-06-19 (×5): qty 10

## 2015-06-19 NOTE — ED Provider Notes (Signed)
[facility] Emergency Department Provider Note  ____________________________________________  Time seen: Approximately 3:57 AM  I have reviewed the triage vital signs and the nursing notes.   HISTORY  Chief Complaint Shortness of Breath  History limited by dementia HPI Karl Mills is a 81 y.o. male EMS reports patient was at the nursing home slumped over forward. He had a low sat. When he was laying back in his chair his sat 1 from the high 80s up to 95 and 98. Nursing home decided to send him here anyway EMS reported "so they did not have to wake up their doctor" patient is on 2 L oxygen at the nursing home records from the nursing home report altered mental status is the reason for him to be seen.  Past Medical History  Diagnosis Date  . COPD (chronic obstructive pulmonary disease)   . Atrial fibrillation   . MDD (major depressive disorder)   . Chronic pain syndrome   . Edema   . Dementia   . Atherosclerotic heart disease   . Insomnia   . GERD (gastroesophageal reflux disease)    other asked medical history nursing home records or unspecified edema, restlessness and agitation, chronic pain, vitamin deficiency unspecified  There are no active problems to display for this patient.   History reviewed. No pertinent past surgical history.  Current Outpatient Rx  Name  Route  Sig  Dispense  Refill  . albuterol (PROVENTIL HFA;VENTOLIN HFA) 108 (90 BASE) MCG/ACT inhaler   Inhalation   Inhale into the lungs every 6 (six) hours as needed for wheezing or shortness of breath.         Marland Kitchen amitriptyline (ELAVIL) 50 MG tablet   Oral   Take 50 mg by mouth at bedtime.         . budesonide-formoterol (SYMBICORT) 160-4.5 MCG/ACT inhaler   Inhalation   Inhale 2 puffs into the lungs 2 (two) times daily.         Marland Kitchen dextromethorphan-guaiFENesin (MUCINEX DM) 30-600 MG per 12 hr tablet   Oral   Take 1 tablet by mouth 2 (two) times daily.         Marland Kitchen  diltiazem (CARDIZEM CD) 300 MG 24 hr capsule   Oral   Take 300 mg by mouth daily.         . folic acid (FOLVITE) 400 MCG tablet   Oral   Take 400 mcg by mouth daily.         . furosemide (LASIX) 20 MG tablet   Oral   Take 20 mg by mouth.         . gabapentin (NEURONTIN) 100 MG capsule   Oral   Take 100 mg by mouth 3 (three) times daily.         . haloperidol (HALDOL) 0.5 MG tablet   Oral   Take 0.5 mg by mouth 2 (two) times daily.         . hydrOXYzine (VISTARIL) 25 MG capsule   Oral   Take 25 mg by mouth 3 (three) times daily as needed.         Marland Kitchen LORazepam (ATIVAN) 1 MG tablet   Oral   Take 1 mg by mouth every 8 (eight) hours.         . magnesium oxide (MAG-OX) 400 MG tablet   Oral   Take 400 mg by mouth daily.         . Melatonin 5 MG TABS   Oral  Take by mouth.         . morphine (ROXANOL) 20 MG/ML concentrated solution   Oral   Take by mouth every 2 (two) hours as needed for severe pain.         . naproxen sodium (ANAPROX) 220 MG tablet   Oral   Take 220 mg by mouth 2 (two) times daily with a meal.         . nitroGLYCERIN (NITROSTAT) 0.4 MG SL tablet   Sublingual   Place 0.4 mg under the tongue every 5 (five) minutes as needed for chest pain.         . polyethylene glycol (MIRALAX / GLYCOLAX) packet   Oral   Take 17 g by mouth daily.         . promethazine (PHENERGAN) 25 MG tablet   Oral   Take 25 mg by mouth every 6 (six) hours as needed for nausea or vomiting.         . ranitidine (ZANTAC) 150 MG tablet   Oral   Take 150 mg by mouth 2 (two) times daily.         Marland Kitchen senna (SENOKOT) 8.6 MG tablet   Oral   Take 1 tablet by mouth daily.         . sertraline (ZOLOFT) 100 MG tablet   Oral   Take 100 mg by mouth daily.         Marland Kitchen tiotropium (SPIRIVA) 18 MCG inhalation capsule   Inhalation   Place 18 mcg into inhaler and inhale daily.         . traZODone (DESYREL) 100 MG tablet   Oral   Take 100 mg by mouth at  bedtime.         Marland Kitchen UNABLE TO FIND   Per post-pyloric tube   120 mLs by Per post-pyloric tube route 2 (two) times daily. Med Name: Med Plus           Allergies Codeine; Penicillins; and Sulfa antibiotics  History reviewed. No pertinent family history.  Social History Social History  Substance Use Topics  . Smoking status: Unknown If Ever Smoked  . Smokeless tobacco: None  . Alcohol Use: No    Review of Systems Unobtainable  ____________________________________________   PHYSICAL EXAM:  VITAL SIGNS: ED Triage Vitals  Enc Vitals Group     BP 06/19/15 0126 105/54 mmHg     Pulse Rate 06/19/15 0126 58     Resp 06/19/15 0126 18     Temp 06/19/15 0126 97.7 F (36.5 C)     Temp Source 06/19/15 0126 Axillary     SpO2 06/19/15 0126 99 %     Weight --      Height --      Head Cir --      Peak Flow --      Pain Score 06/19/15 0127 0     Pain Loc --      Pain Edu? --      Excl. in GC? --     Constitutional: Sleeping and difficult to arouse Eyes: Conjunctivae are normal. PERRL. EOMI.  patient resists eye opening Head: Atraumatic. Nose: No congestion/rhinnorhea. Mouth/Throat: Mucous membranes are somewhat moist.  Oropharynx non-erythematous. Neck: No stridor.  Cardiovascular: Normal rate, regular rhythm. Grossly normal heart sounds.  Good peripheral circulation. Respiratory: Normal respiratory effort.  No retractions. Lungs CTAB. Gastrointestinal: Soft and nontender. No distention. No abdominal bruits. No CVA tenderness. Musculoskeletal: No lower extremity tenderness nor edema.  No joint effusions.   ____________________________________________   LABS (all labs ordered are listed, but only abnormal results are displayed)  Labs Reviewed  COMPREHENSIVE METABOLIC PANEL - Abnormal; Notable for the following:    Potassium 5.5 (*)    Glucose, Bld 101 (*)    BUN 54 (*)    Creatinine, Ser 1.43 (*)    Albumin 3.1 (*)    ALT 11 (*)    GFR calc non Af Amer 44 (*)     GFR calc Af Amer 51 (*)    All other components within normal limits  BRAIN NATRIURETIC PEPTIDE - Abnormal; Notable for the following:    B Natriuretic Peptide 294.0 (*)    All other components within normal limits  CBC WITH DIFFERENTIAL/PLATELET - Abnormal; Notable for the following:    RBC 2.95 (*)    Hemoglobin 8.8 (*)    HCT 26.7 (*)    RDW 19.0 (*)    All other components within normal limits  URINALYSIS COMPLETEWITH MICROSCOPIC ([facility] ONLY) - Abnormal; Notable for the following:    Color, Urine YELLOW (*)    APPearance CLEAR (*)    Protein, ur 30 (*)    Leukocytes, UA TRACE (*)    All other components within normal limits  TROPONIN I   ____________________________________________  EKG  EKG read and interpreted by me shows sinus bradycardia rate of 58 normal axis no acute changes ____________________________________________  RADIOLOGY  Chest x-ray read by the radiologist ____________________________________________   PROCEDURES   ____________________________________________   INITIAL IMPRESSION / ASSESSMENT AND PLAN / ED COURSE  Pertinent labs & imaging results that were available during my care of the patient were reviewed by me and considered in my medical decision making (see chart for details). Contacted the hospitalist for possible admission since patient is dehydrated and has Hemoccult-positive stool with a drop in his hematocrit and hemoglobin. Hospitalist was asked for a CT head and CT abdomen first  ____________________________________________   FINAL CLINICAL IMPRESSION(S) / ED DIAGNOSES  Final diagnoses:  Dehydration  Acute on chronic diastolic congestive heart failure  Anemia, unspecified anemia type  Blood in stool      Arnaldo Natal, MD 06/21/15 1539

## 2015-06-19 NOTE — Plan of Care (Signed)
Problem: Discharge Progression Outcomes Goal: Other Discharge Outcomes/Goals Outcome: Progressing VSS. O2 sats 92% on 3L O2 per [place]. Pt alert to self only. Lethargic on admission, meds not given, MD aware. Incontinent. In the afternoon pt was agitated and tried to get oob, MD notified, ativan ordered and given with good effect. Telemetry monitored. Sacral dressing applied prophylaticaly. Heels elevated.

## 2015-06-19 NOTE — Plan of Care (Signed)
Problem: Discharge Progression Outcomes Goal: Barriers To Progression Addressed/Resolved Individualization: Pt is from [employer]. High fall risk- Offer toileting qx1hr with safety checks. Pt has urgency and is incontinent. H/O COPD, A Fib, dementia, CAD, GERD, MDD - controlled with meds.

## 2015-06-19 NOTE — ED Notes (Addendum)
Pt presents to ED via EMS from [facility] with c/o difficulty breathing and desat O2-80% while sitting on a chair at the facility. EMS reports Saturation a 95-98% on 2L [place] while being transported to ED. Pt on O2 2L [place] [address] at the facility. Pt oriented to self at this time, family reports this a baseline for the pt.

## 2015-06-19 NOTE — H&P (Signed)
[facility] Physicians - [place] at [facility]   PATIENT NAME: Karl Mills    MR#:  [id number]  DATE OF BIRTH:  [birth year]  DATE OF ADMISSION:  06/19/2015  PRIMARY CARE PHYSICIAN: Derwood Kaplan, MD   REQUESTING/REFERRING PHYSICIAN: Dr. Darnelle Catalan  CHIEF COMPLAINT:   Chief Complaint  Patient presents with  . Shortness of Breath    HISTORY OF PRESENT ILLNESS: Karl Mills  is a 81 y.o. male with a known history of COPD, atrial fibrillation, dementia, coronary artery disease who is a nursing home resident and having a DO NOT RESUSCITATE status over there, but at his baseline he is walking without support and able to carry out conversations. Last night he walk to the nursing station and he was appearing very lethargic he slumped over on the chair and the nurse checked his vitals his oxygen saturation was noted 84%, blood pressure was 97/46, so she called the doctor on-call and he suggested to send him to emergency room for further evaluation. Patient is drowsy but arousable easily but he appears very confused and not able to give me any history so all this information was obtained by calling the nurse at his nursing home who has sent him to hospital. I also tried to call his contact person Mr. Timothy Merkel, and left a message on his phone for call back. In ER he was noted to have slight drop in his hemoglobin compared to last year in his stool was noted positive for guaiac, his urinalysis was negative and he did not had a rising his white blood cell count. CT scan of the abdomen was done which showed dilation of bile duct and pancreatic duct and some constipation, with some questionable infiltrate and thickening of the airways in both lower lung fields.  PAST MEDICAL HISTORY:   Past Medical History  Diagnosis Date  . COPD (chronic obstructive pulmonary disease)   . Atrial fibrillation   . MDD (major depressive disorder)   . Chronic pain syndrome   . Edema   . Dementia    . Atherosclerotic heart disease   . Insomnia   . GERD (gastroesophageal reflux disease)     PAST SURGICAL HISTORY: History reviewed. No pertinent past surgical history.  SOCIAL HISTORY:  Social History  Substance Use Topics  . Smoking status: Unknown If Ever Smoked  . Smokeless tobacco: Not on file  . Alcohol Use: No    FAMILY HISTORY:  Family History  Problem Relation Age of Onset  . CAD Father     DRUG ALLERGIES:  Allergies  Allergen Reactions  . Codeine   . Penicillins   . Sulfa Antibiotics     REVIEW OF SYSTEMS:   Patient is drowsy but arousable, remains very confused and not able to answer any questions to me.  MEDICATIONS AT HOME:  Prior to Admission medications   Medication Sig Start Date End Date Taking? Authorizing Provider  albuterol (PROVENTIL HFA;VENTOLIN HFA) 108 (90 BASE) MCG/ACT inhaler Inhale into the lungs every 6 (six) hours as needed for wheezing or shortness of breath.    Historical Provider, MD  amitriptyline (ELAVIL) 50 MG tablet Take 50 mg by mouth at bedtime.    Historical Provider, MD  budesonide-formoterol (SYMBICORT) 160-4.5 MCG/ACT inhaler Inhale 2 puffs into the lungs 2 (two) times daily.    Historical Provider, MD  dextromethorphan-guaiFENesin (MUCINEX DM) 30-600 MG per 12 hr tablet Take 1 tablet by mouth 2 (two) times daily.    Historical Provider, MD  diltiazem (CARDIZEM CD) 300 MG 24 hr capsule Take 300 mg by mouth daily.    Historical Provider, MD  folic acid (FOLVITE) 400 MCG tablet Take 400 mcg by mouth daily.    Historical Provider, MD  furosemide (LASIX) 20 MG tablet Take 20 mg by mouth.    Historical Provider, MD  gabapentin (NEURONTIN) 100 MG capsule Take 100 mg by mouth 3 (three) times daily.    Historical Provider, MD  haloperidol (HALDOL) 0.5 MG tablet Take 0.5 mg by mouth 2 (two) times daily.    Historical Provider, MD  hydrOXYzine (VISTARIL) 25 MG capsule Take 25 mg by mouth 3 (three) times daily as needed.    Historical  Provider, MD  LORazepam (ATIVAN) 1 MG tablet Take 1 mg by mouth every 8 (eight) hours.    Historical Provider, MD  magnesium oxide (MAG-OX) 400 MG tablet Take 400 mg by mouth daily.    Historical Provider, MD  Melatonin 5 MG TABS Take by mouth.    Historical Provider, MD  morphine (ROXANOL) 20 MG/ML concentrated solution Take by mouth every 2 (two) hours as needed for severe pain.    Historical Provider, MD  naproxen sodium (ANAPROX) 220 MG tablet Take 220 mg by mouth 2 (two) times daily with a meal.    Historical Provider, MD  nitroGLYCERIN (NITROSTAT) 0.4 MG SL tablet Place 0.4 mg under the tongue every 5 (five) minutes as needed for chest pain.    Historical Provider, MD  polyethylene glycol (MIRALAX / GLYCOLAX) packet Take 17 g by mouth daily.    Historical Provider, MD  promethazine (PHENERGAN) 25 MG tablet Take 25 mg by mouth every 6 (six) hours as needed for nausea or vomiting.    Historical Provider, MD  ranitidine (ZANTAC) 150 MG tablet Take 150 mg by mouth 2 (two) times daily.    Historical Provider, MD  senna (SENOKOT) 8.6 MG tablet Take 1 tablet by mouth daily.    Historical Provider, MD  sertraline (ZOLOFT) 100 MG tablet Take 100 mg by mouth daily.    Historical Provider, MD  tiotropium (SPIRIVA) 18 MCG inhalation capsule Place 18 mcg into inhaler and inhale daily.    Historical Provider, MD  traZODone (DESYREL) 100 MG tablet Take 100 mg by mouth at bedtime.    Historical Provider, MD  UNABLE TO FIND 120 mLs by Per post-pyloric tube route 2 (two) times daily. Med Name: Med Plus    Historical Provider, MD      PHYSICAL EXAMINATION:   VITAL SIGNS: Blood pressure 108/54, pulse 58, temperature 97.7 F (36.5 C), temperature source Axillary, resp. rate 23, SpO2 100 %.  GENERAL:  81 y.o.-year-old patient thin and cachectic ,lying in the bed with no acute distress.  EYES: Pupils equal, round, reactive to light and accommodation. No scleral icterus. Extraocular muscles intact.  HEENT:  Head atraumatic, normocephalic. Oropharynx and nasopharynx clear. Mucosa dry NECK:  Supple, no jugular venous distention. No thyroid enlargement, no tenderness.  LUNGS: Normal breath sounds bilaterally, no wheezing, some crepitations . No use of accessory muscles of respiration.  CARDIOVASCULAR: S1, S2 normal. No murmurs, rubs, or gallops.  ABDOMEN: Soft, nontender, nondistended. Bowel sounds present. No organomegaly or mass.  EXTREMITIES: No pedal edema, cyanosis, or clubbing.  NEUROLOGIC: Cranial nerves unable to check due to mental status. Muscle strength 4/5 in all extremities, moves spontaneously to stimuli but not following commands.  Gait not checked.  PSYCHIATRIC: The patient is drowsy but arousable with stimuli, remains very confused on  arousable. SKIN: No obvious rash, lesion, or ulcer.   LABORATORY PANEL:   CBC  Recent Labs Lab 06/19/15 0301  WBC 8.3  HGB 8.8*  HCT 26.7*  PLT 157  MCV 90.5  MCH 29.7  MCHC 32.9  RDW 19.0*  LYMPHSABS 1.1  MONOABS 1.0  EOSABS 0.0  BASOSABS 0.0   ------------------------------------------------------------------------------------------------------------------  Chemistries   Recent Labs Lab 06/19/15 0301  NA 143  K 5.5*  CL 107  CO2 31  GLUCOSE 101*  BUN 54*  CREATININE 1.43*  CALCIUM 10.3  AST 18  ALT 11*  ALKPHOS 59  BILITOT 0.7   ------------------------------------------------------------------------------------------------------------------ CrCl cannot be calculated (Unknown ideal weight.). ------------------------------------------------------------------------------------------------------------------ No results for input(s): TSH, T4TOTAL, T3FREE, THYROIDAB in the last 72 hours.  Invalid input(s): FREET3   Coagulation profile No results for input(s): INR, PROTIME in the last 168 hours. ------------------------------------------------------------------------------------------------------------------- No results  for input(s): DDIMER in the last 72 hours. -------------------------------------------------------------------------------------------------------------------  Cardiac Enzymes  Recent Labs Lab 06/19/15 0301  TROPONINI <0.03    Urinalysis    Component Value Date/Time   COLORURINE YELLOW* 06/19/2015 0454   COLORURINE Yellow 08/27/2014 2318   APPEARANCEUR CLEAR* 06/19/2015 0454   APPEARANCEUR Clear 08/27/2014 2318   LABSPEC 1.018 06/19/2015 0454   LABSPEC 1.023 08/27/2014 2318   PHURINE 5.0 06/19/2015 0454   PHURINE 6.0 08/27/2014 2318   GLUCOSEU NEGATIVE 06/19/2015 0454   GLUCOSEU Negative 08/27/2014 2318   HGBUR NEGATIVE 06/19/2015 0454   HGBUR Negative 08/27/2014 2318   BILIRUBINUR NEGATIVE 06/19/2015 0454   BILIRUBINUR Negative 08/27/2014 2318   KETONESUR NEGATIVE 06/19/2015 0454   KETONESUR Negative 08/27/2014 2318   PROTEINUR 30* 06/19/2015 0454   PROTEINUR Negative 08/27/2014 2318   NITRITE NEGATIVE 06/19/2015 0454   NITRITE Negative 08/27/2014 2318   LEUKOCYTESUR TRACE* 06/19/2015 0454   LEUKOCYTESUR Negative 08/27/2014 2318     RADIOLOGY: Dg Chest 2 View  06/19/2015   CLINICAL DATA:  Shortness of breath and hypoxia.  EXAM: CHEST  2 VIEW  COMPARISON:  03/13/2015  FINDINGS: Lower lung volumes from prior exam. Advanced emphysematous change again seen. Diffusely increased interstitial markings, may be related to lower lung volumes versus pulmonary edema. There is mild fluid in the right minor fissure. No confluent airspace disease. Cardiomediastinal contours are unchanged allowing for differences in technique, a coronary stent is seen. The bones are under mineralized.  IMPRESSION: Advanced emphysema. There is interval increased interstitial markings which may be related to lower lung volumes versus pulmonary edema. Minimal fluid in the right minor fissure, no definite subpulmonic pleural effusion.   Electronically Signed   By: Rubye Oaks M.D.   On: 06/19/2015 02:20    Ct Head Wo Contrast  06/19/2015   CLINICAL DATA:  Oxygen desaturation. History of dementia, currently at baseline. History of atrial fibrillation.  EXAM: CT HEAD WITHOUT CONTRAST  TECHNIQUE: Contiguous axial images were obtained from the base of the skull through the vertex without intravenous contrast.  COMPARISON:  CT head March 13, 2015  FINDINGS: Mild motion degraded examination.  Moderate to severe ventriculomegaly, asymmetric LEFT ventricle involvement is unchanged. Proportional enlargement of cerebral sulci and cerebellar folia. Old LEFT caudate in RIGHT internal capsule lacunar infarcts. No intraparenchymal hemorrhage, mass effect or midline shift. No acute large vascular territory infarcts. Small area LEFT mesial frontal lobe encephalomalacia. Confluent supratentorial white matter hypodensities compatible chronic small vessel ischemic disease.  No abnormal extra-axial fluid collections. Basal cisterns are patent. Severe calcific atherosclerosis the carotid bulbs.  No skull fracture. Ocular  globes and orbital contents are unremarkable. Paranasal sinuses and imaged mastoid air cells are well aerated. Patient is edentulous.  IMPRESSION: No acute intracranial process on this mildly motion degraded examination.  Stable appearance of the head including moderate to severe brain atrophy ; LEFT mesial frontal lobe encephalomalacia may be post ischemic or traumatic.   Electronically Signed   By: Awilda Metro M.D.   On: 06/19/2015 06:51   Ct Abdomen Pelvis W Contrast  06/19/2015   CLINICAL DATA:  Hemoccult positive stool.  EXAM: CT ABDOMEN AND PELVIS WITH CONTRAST  TECHNIQUE: Multidetector CT imaging of the abdomen and pelvis was performed using the standard protocol following bolus administration of intravenous contrast.  CONTRAST:  75mL OMNIPAQUE IOHEXOL 300 MG/ML  SOLN  COMPARISON:  CT 08/27/2014  FINDINGS: Lower chest: Small pleural effusions, right greater than left. Adjacent consolidation within both  lower lobes. There is diffuse bronchial thickening, severe in the lower lobes.  Liver: Progressive intrahepatic biliary ductal dilatation. Calcified granuloma in the right lobe. Hypertrophied left hepatic lobe. No evidence of focal lesion.  Hepatobiliary: Gallbladder is distended. There is progressive biliary dilatation, common bile duct measures 13 mm at the porta hepatis, and is dilated throughout its course. This is new from prior exam.  Pancreas: Development of pancreatic ductal dilatation, 5 mm in the pancreatic head. Atrophy of the pancreatic parenchyma. No peripancreatic inflammatory change.  Spleen: Multiple granuloma.  Adrenal glands: Left adrenal glands mildly thickened. Right adrenal gland suboptimally assessed, tentatively normal.  Kidneys: Symmetric renal enhancement. No hydronephrosis. There is thinning of the renal parenchyma. There is a cyst in the interpolar left kidney. No renal excretion on delayed phase imaging.  Stomach/Bowel: Stomach is decompressed. Limited bowel assessment given lack of contrast and paucity of intra-abdominal fat. Large volume of colonic stool. No definite colonic wall thickening. The appendix is not definitively identified.  Vascular/Lymphatic: No retroperitoneal adenopathy. Abdominal aorta is normal in caliber. Dense atherosclerosis of the abdominal aorta and its branches, including the origin of the renal arteries.  Reproductive: Central prostatic calcifications.  Bladder: Distended.  No bladder wall thickening.  Other: No free air, free fluid, or intra-abdominal fluid collection.  Musculoskeletal: Peripherally sclerotic lesion in the right iliac bone is unchanged. There are no acute or suspicious osseous abnormalities.  IMPRESSION: 1. Progressive biliary dilatation, now with pancreatic ductal dilatation. Findings are concerning for pancreatic head mass, however no lesion is discretely visualized by CT. MRCP/MRI pancreatic protocol is the optimal study for further  evaluation, however patient would have to be able to tolerate breath hold technique. 2. Large volume of colonic stool. No colonic wall thickening or etiology for GI bleed. 3. No renal excretion on delayed phase imaging, consistent with renal dysfunction. 4. Dense atherosclerosis. 5. Consolidation in both lower lobes with diminutive bilateral pleural effusions. Atelectasis versus aspiration. There is diffuse bronchial thickening of the lower lobes.   Electronically Signed   By: Rubye Oaks M.D.   On: 06/19/2015 06:55    IMPRESSION AND PLAN:  * Altered mental status  Likely secondary to pneumonia  There is no rising white cell count but he has thickening of airways and questionable infiltrate on his lower lung fields.   IV Rocephin and azithromycin for now.  Patient is DO NOT RESUSCITATE.  * Hyperkalemia  Oral Kayexalate.  Recheck tomorrow.  * Anemia  Stool guaiac is positive,  Finding of dilation of biliary and pancreatic duct, possibility of mass in pancreas.  I will speak to his primary contact person  Mr. Marcial Pacas and ask him, how aggressively he would like to work out on this issue and if needed then we'll call GI consult.  * Chronic pain  He is DO NOT RESUSCITATE and as per the records he appeared having hospice services at nursing home.  He is on oral morphine solution, I will decrease the dose as he is presented with altered mental status.  * Constipation  Continue MiraLAX, he will receive Kayexalate for now.  All the records are reviewed and case discussed with ED provider. Management plans discussed with the patient, family and they are in agreement.  CODE STATUS: Advance Directive Documentation        Most Recent Value   Type of Advance Directive  Out of facility DNR (pink MOST or yellow form)   Pre-existing out of facility DNR order (yellow form or pink MOST form)     "MOST" Form in Place?         TOTAL TIME TAKING CARE OF THIS PATIENT:50 minutes.    Altamese Dilling M.D on 06/19/2015   Between 7am to 6pm - Pager - [phone]  After 6pm go to [email or web address] - password [email or web address] [facility]  [facility]  Office  [phone]  CC: Primary care physician; Derwood Kaplan, MD

## 2015-06-19 NOTE — Progress Notes (Signed)
Spoke to son on phone- As per his old age , dementia, COPD, and other medical issues likel Hx of prostate cancer, Lung scars.  He agreed not to do aggressive work up for Anemia and blood in stool or Possible pancreatic mass.  So we will treat for pneumonia and once he is back to his baseline, d/c to NH.

## 2015-06-20 LAB — BASIC METABOLIC PANEL
ANION GAP: 4 — AB (ref 5–15)
BUN: 45 mg/dL — ABNORMAL HIGH (ref 6–20)
CO2: 32 mmol/L (ref 22–32)
Calcium: 9.7 mg/dL (ref 8.9–10.3)
Chloride: 108 mmol/L (ref 101–111)
Creatinine, Ser: 1.3 mg/dL — ABNORMAL HIGH (ref 0.61–1.24)
GFR, EST AFRICAN AMERICAN: 58 mL/min — AB (ref >60)
GFR, EST NON AFRICAN AMERICAN: 50 mL/min — AB (ref >60)
Glucose, Bld: 73 mg/dL (ref 65–99)
POTASSIUM: 3.9 mmol/L (ref 3.5–5.1)
SODIUM: 144 mmol/L (ref 135–145)

## 2015-06-20 LAB — GLUCOSE, CAPILLARY
GLUCOSE-CAPILLARY: 60 mg/dL — AB (ref 65–99)
GLUCOSE-CAPILLARY: 159 mg/dL — AB (ref 65–99)
Glucose-Capillary: 69 mg/dL (ref 65–99)

## 2015-06-20 LAB — CBC
HEMATOCRIT: 28.7 % — AB (ref 40.0–52.0)
HEMOGLOBIN: 9.5 g/dL — AB (ref 13.0–18.0)
MCH: 30 pg (ref 26.0–34.0)
MCHC: 33.1 g/dL (ref 32.0–36.0)
MCV: 90.6 fL (ref 80.0–100.0)
Platelets: 201 10*3/uL (ref 150–440)
RBC: 3.16 MIL/uL — AB (ref 4.40–5.90)
RDW: 18.8 % — AB (ref 11.5–14.5)
WBC: 7.3 10*3/uL (ref 3.8–10.6)

## 2015-06-20 MED ORDER — DEXTROSE 50 % IV SOLN
1.0000 | Freq: Once | INTRAVENOUS | Status: AC
  Administered 2015-06-20: 08:00:00 50 mL via INTRAVENOUS
  Filled 2015-06-20: qty 50

## 2015-06-20 NOTE — Plan of Care (Signed)
Problem: Discharge Progression Outcomes Goal: Other Discharge Outcomes/Goals Outcome: Not Progressing Pt just admitted yesterday from [facility]. Barriers to progression include NPO status and immobility.  No complaints or s/sx of pain. Hemodynamics: Vital signs stable. ST with PVC's on the monitor. Complications: Admitted with medical dx of pneumonia Diet: NPO except meds. Pt emaciated. Poor nutritional status.

## 2015-06-20 NOTE — Plan of Care (Signed)
Problem: Discharge Progression Outcomes Goal: Other Discharge Outcomes/Goals Outcome: Progressing VSS. Currently O2 sats at 96% with 1L O2 per [place]. Alert to self only. Calm. Pt sleeping between care, but easily aroused. Tolerates diet. Assistance with feeding required. No signs of pain nor discomfort.

## 2015-06-20 NOTE — Clinical Social Work Note (Signed)
Clinical Social Work Assessment  Patient Details  Name: Karl Mills MRN: [id number] Date of Birth: 28-Feb-1932  Date of referral:  06/20/15               Reason for consult:   (from [facility])                Permission sought to share information with:  Facility Medical sales representative, Family Supports Permission granted to share information::  Yes, Verbal Permission Granted (given from son Jorja Loa)  Name::      (Son  Darrall Strey [phone])  Agency::   [facility])  Relationship::     Contact Information:     Housing/Transportation Living arrangements for the past 2 months:  Skilled Nursing Facility Source of Information:  Adult Children Patient Interpreter Needed:  None Criminal Activity/Legal Involvement Pertinent to Current Situation/Hospitalization:  No - Comment as needed Significant Relationships:  Adult Children Lives with:  Facility Resident Do you feel safe going back to the place where you live?  Yes (per patient's son) Need for family participation in patient care:  Yes (Comment)  Care giving concerns:  No concerns reported   Office manager / plan:  Patient is an 81 year old male. Currently resides at [facility]. Patient is oriented to self and baseline is confused.  Per patient's son Jorja Loa he is HPOA and is the primary contact person. Patient has two other daughters.  Patient is followed by [facility] at [facility].  When at facility patient mostly uses a wheel chair to move around.  Per son patient will return to [facility] when discharged from the hospital.   CSW will complete FL2, place on chart and send to [facility] in anticipation of patient discharging back to facility when medically ready.    Employment status:  Disabled (Comment on whether or not currently receiving Disability) Insurance information:  Medicare, Medicaid In [place] PT Recommendations:  Not assessed at this time Information / Referral to community resources:   Skilled Nursing Facility  Patient/Family's Response to care:  Son is in agreement with plan and will transport patient back to facility.  Patient/Family's Understanding of and Emotional Response to Diagnosis, Current Treatment, and Prognosis:  Son understands patient will remain in the hospital until he is medically clear. Patient will discharge back to [facility] with [facility] following.  Emotional Assessment Appearance:  Appears stated age Attitude/Demeanor/Rapport:  Unable to Assess Affect (typically observed):  Unable to Assess Orientation:  Oriented to Self Alcohol / Substance use:  Not Applicable Psych involvement (Current and /or in the community):  No (Comment) (none reported)  Discharge Needs  Concerns to be addressed:  Denies Needs/Concerns at this time Readmission within the last 30 days:  No Current discharge risk:  Chronically ill Barriers to Discharge:  No Barriers Identified   Soundra Pilon, LCSW 06/20/2015, 10:09 AM Sammuel Hines. Theresia Majors, MSW Clinical Social Work Department Emergency Room [phone] 10:12 AM

## 2015-06-20 NOTE — Progress Notes (Signed)
[facility] - [place] at [facility]   PATIENT NAME: Karl Mills    MR#:  [id number]  DATE OF BIRTH:  August 30, 1932  SUBJECTIVE:  CHIEF COMPLAINT:   Chief Complaint  Patient presents with  . Shortness of Breath   More alert , still confused today.  REVIEW OF SYSTEMS:   Pt is confused, not able to provide any details.  ROS  DRUG ALLERGIES:   Allergies  Allergen Reactions  . Penicillins Swelling  . Codeine Rash  . Sulfa Antibiotics Rash    VITALS:  Blood pressure 128/71, pulse 91, temperature 97.6 F (36.4 C), temperature source Oral, resp. rate 18, height  (1.676 m), weight 48.762 kg (107 lb 8 oz), SpO2 98 %.  PHYSICAL EXAMINATION:   GENERAL: 81 y.o.-year-old patient thin and cachectic ,lying in the bed with no acute distress.  EYES: Pupils equal, round, reactive to light and accommodation. No scleral icterus. Extraocular muscles intact.  HEENT: Head atraumatic, normocephalic. Oropharynx and nasopharynx clear. Mucosa dry. NECK: Supple, no jugular venous distention. No thyroid enlargement, no tenderness.  LUNGS: Normal breath sounds bilaterally, no wheezing, some crepitations . No use of accessory muscles of respiration.  CARDIOVASCULAR: S1, S2 normal. No murmurs, rubs, or gallops.  ABDOMEN: Soft, nontender, nondistended. Bowel sounds present. No organomegaly or mass.  EXTREMITIES: No pedal edema, cyanosis, or clubbing.  NEUROLOGIC: Cranial nerves unable to check due to mental status. Muscle strength 4/5 in all extremities, moves spontaneously to stimuli but not following commands. Gait not checked.  PSYCHIATRIC: The patient is alert but remains very confused. SKIN: No obvious rash, lesion, or ulcer.  Physical Exam LABORATORY PANEL:   CBC  Recent Labs Lab 06/20/15 0509  WBC 7.3  HGB 9.5*  HCT 28.7*  PLT 201    ------------------------------------------------------------------------------------------------------------------  Chemistries   Recent Labs Lab 06/19/15 0301 06/20/15 0509  NA 143 144  K 5.5* 3.9  CL 107 108  CO2 31 32  GLUCOSE 101* 73  BUN 54* 45*  CREATININE 1.43* 1.30*  CALCIUM 10.3 9.7  AST 18  --   ALT 11*  --   ALKPHOS 59  --   BILITOT 0.7  --    ------------------------------------------------------------------------------------------------------------------  Cardiac Enzymes  Recent Labs Lab 06/19/15 0301  TROPONINI <0.03   ------------------------------------------------------------------------------------------------------------------  RADIOLOGY:  Dg Chest 2 View  06/19/2015   CLINICAL DATA:  Shortness of breath and hypoxia.  EXAM: CHEST  2 VIEW  COMPARISON:  03/13/2015  FINDINGS: Lower lung volumes from prior exam. Advanced emphysematous change again seen. Diffusely increased interstitial markings, may be related to lower lung volumes versus pulmonary edema. There is mild fluid in the right minor fissure. No confluent airspace disease. Cardiomediastinal contours are unchanged allowing for differences in technique, a coronary stent is seen. The bones are under mineralized.  IMPRESSION: Advanced emphysema. There is interval increased interstitial markings which may be related to lower lung volumes versus pulmonary edema. Minimal fluid in the right minor fissure, no definite subpulmonic pleural effusion.   Electronically Signed   By: Rubye Oaks M.D.   On: 06/19/2015 02:20   Ct Head Wo Contrast  06/19/2015   CLINICAL DATA:  Oxygen desaturation. History of dementia, currently at baseline. History of atrial fibrillation.  EXAM: CT HEAD WITHOUT CONTRAST  TECHNIQUE: Contiguous axial images were obtained from the base of the skull through the vertex without intravenous contrast.  COMPARISON:  CT head March 13, 2015  FINDINGS: Mild motion degraded examination.   Moderate to severe  ventriculomegaly, asymmetric LEFT ventricle involvement is unchanged. Proportional enlargement of cerebral sulci and cerebellar folia. Old LEFT caudate in RIGHT internal capsule lacunar infarcts. No intraparenchymal hemorrhage, mass effect or midline shift. No acute large vascular territory infarcts. Small area LEFT mesial frontal lobe encephalomalacia. Confluent supratentorial white matter hypodensities compatible chronic small vessel ischemic disease.  No abnormal extra-axial fluid collections. Basal cisterns are patent. Severe calcific atherosclerosis the carotid bulbs.  No skull fracture. Ocular globes and orbital contents are unremarkable. Paranasal sinuses and imaged mastoid air cells are well aerated. Patient is edentulous.  IMPRESSION: No acute intracranial process on this mildly motion degraded examination.  Stable appearance of the head including moderate to severe brain atrophy ; LEFT mesial frontal lobe encephalomalacia may be post ischemic or traumatic.   Electronically Signed   By: Awilda Metro M.D.   On: 06/19/2015 06:51   Ct Abdomen Pelvis W Contrast  06/19/2015   CLINICAL DATA:  Hemoccult positive stool.  EXAM: CT ABDOMEN AND PELVIS WITH CONTRAST  TECHNIQUE: Multidetector CT imaging of the abdomen and pelvis was performed using the standard protocol following bolus administration of intravenous contrast.  CONTRAST:  75mL OMNIPAQUE IOHEXOL 300 MG/ML  SOLN  COMPARISON:  CT 08/27/2014  FINDINGS: Lower chest: Small pleural effusions, right greater than left. Adjacent consolidation within both lower lobes. There is diffuse bronchial thickening, severe in the lower lobes.  Liver: Progressive intrahepatic biliary ductal dilatation. Calcified granuloma in the right lobe. Hypertrophied left hepatic lobe. No evidence of focal lesion.  Hepatobiliary: Gallbladder is distended. There is progressive biliary dilatation, common bile duct measures 13 mm at the porta hepatis, and is dilated  throughout its course. This is new from prior exam.  Pancreas: Development of pancreatic ductal dilatation, 5 mm in the pancreatic head. Atrophy of the pancreatic parenchyma. No peripancreatic inflammatory change.  Spleen: Multiple granuloma.  Adrenal glands: Left adrenal glands mildly thickened. Right adrenal gland suboptimally assessed, tentatively normal.  Kidneys: Symmetric renal enhancement. No hydronephrosis. There is thinning of the renal parenchyma. There is a cyst in the interpolar left kidney. No renal excretion on delayed phase imaging.  Stomach/Bowel: Stomach is decompressed. Limited bowel assessment given lack of contrast and paucity of intra-abdominal fat. Large volume of colonic stool. No definite colonic wall thickening. The appendix is not definitively identified.  Vascular/Lymphatic: No retroperitoneal adenopathy. Abdominal aorta is normal in caliber. Dense atherosclerosis of the abdominal aorta and its branches, including the origin of the renal arteries.  Reproductive: Central prostatic calcifications.  Bladder: Distended.  No bladder wall thickening.  Other: No free air, free fluid, or intra-abdominal fluid collection.  Musculoskeletal: Peripherally sclerotic lesion in the right iliac bone is unchanged. There are no acute or suspicious osseous abnormalities.  IMPRESSION: 1. Progressive biliary dilatation, now with pancreatic ductal dilatation. Findings are concerning for pancreatic head mass, however no lesion is discretely visualized by CT. MRCP/MRI pancreatic protocol is the optimal study for further evaluation, however patient would have to be able to tolerate breath hold technique. 2. Large volume of colonic stool. No colonic wall thickening or etiology for GI bleed. 3. No renal excretion on delayed phase imaging, consistent with renal dysfunction. 4. Dense atherosclerosis. 5. Consolidation in both lower lobes with diminutive bilateral pleural effusions. Atelectasis versus aspiration. There  is diffuse bronchial thickening of the lower lobes.   Electronically Signed   By: Rubye Oaks M.D.   On: 06/19/2015 06:55    ASSESSMENT AND PLAN:   * Altered mental status Likely secondary to  pneumonia- better now, still not oriented- likely baseline dementia. There is no rising white cell count but he has thickening of airways and questionable infiltrate on his lower lung fields.  IV Rocephin and azithromycin for now. Patient is DO NOT RESUSCITATE.  * Hyperkalemia Oral Kayexalate. Rechecked normal..  * Anemia Stool guaiac is positive, Finding of dilation of biliary and pancreatic duct, possibility of mass in pancreas. Spoke to son, due to his baseline medical conditions, decreased functional status and dementia- he will not like to do any aggressive management of this.   Hb stable on follow up.  * Chronic pain He is DO NOT RESUSCITATE and as per the records he appeared having hospice services at nursing home. He is on oral morphine solution, I will decrease the dose as he is presented with altered mental status.  * Constipation Continue MiraLAX.  All the records are reviewed and case discussed with Care Management/Social Workerr. Management plans discussed with the patient, family and they are in agreement.  CODE STATUS: DNR  TOTAL TIME TAKING CARE OF THIS PATIENT: 30 minutes.     POSSIBLE D/C IN 1-2 DAYS, DEPENDING ON CLINICAL CONDITION.   Altamese Dilling M.D on 06/20/2015   Between 7am to 6pm - Pager - [phone]  After 6pm go to [email or web address] - password [email or web address] [facility]  [facility]  Office  [phone]  CC: Primary care physician; Derwood Kaplan, MD

## 2015-06-21 MED ORDER — HALOPERIDOL LACTATE 5 MG/ML IJ SOLN
0.5000 mg | Freq: Once | INTRAMUSCULAR | Status: AC
  Administered 2015-06-21: 0.5 mg via INTRAVENOUS
  Filled 2015-06-21: qty 1

## 2015-06-21 MED ORDER — FERROUS SULFATE 325 (65 FE) MG PO TABS
325.0000 mg | ORAL_TABLET | Freq: Two times a day (BID) | ORAL | Status: DC
  Administered 2015-06-21 – 2015-06-22 (×2): 325 mg via ORAL
  Filled 2015-06-21 (×2): qty 1

## 2015-06-21 MED ORDER — SODIUM CHLORIDE 0.9 % IJ SOLN: 3.0000 mL | INTRAMUSCULAR | Status: DC | PRN

## 2015-06-21 MED ORDER — SODIUM CHLORIDE 0.9 % IJ SOLN
3.0000 mL | Freq: Two times a day (BID) | INTRAMUSCULAR | Status: DC
  Administered 2015-06-21 – 2015-06-22 (×2): 3 mL via INTRAVENOUS

## 2015-06-21 NOTE — Progress Notes (Signed)
[facility] - [place] at [facility]   PATIENT NAME: Karl Mills    MR#:  [id number]  DATE OF BIRTH:  1931/10/29  SUBJECTIVE:  CHIEF COMPLAINT:   Chief Complaint  Patient presents with  . Shortness of Breath   More alert , still confused today.  REVIEW OF SYSTEMS:   Pt is confused, not able to provide any details.  ROS  DRUG ALLERGIES:   Allergies  Allergen Reactions  . Penicillins Swelling  . Codeine Rash  . Sulfa Antibiotics Rash    VITALS:  Blood pressure 140/76, pulse 95, temperature 97.5 F (36.4 C), temperature source Oral, resp. rate 18, height  (1.676 m), weight 48.762 kg (107 lb 8 oz), SpO2 94 %.  PHYSICAL EXAMINATION:   GENERAL: 81 y.o.-year-old patient thin and cachectic ,lying in the bed with no acute distress.  EYES: Pupils equal, round, reactive to light and accommodation. No scleral icterus. Extraocular muscles intact.  HEENT: Head atraumatic, normocephalic. Oropharynx and nasopharynx clear. Mucosa dry. NECK: Supple, no jugular venous distention. No thyroid enlargement, no tenderness.  LUNGS: Normal breath sounds bilaterally, no wheezing, some crepitations . No use of accessory muscles of respiration.  CARDIOVASCULAR: S1, S2 normal. No murmurs, rubs, or gallops.  ABDOMEN: Soft, nontender, nondistended. Bowel sounds present. No organomegaly or mass.  EXTREMITIES: No pedal edema, cyanosis, or clubbing.  NEUROLOGIC: Cranial nerves unable to check due to mental status. Muscle strength 4/5 in all extremities, moves spontaneously to stimuli but not following commands. Gait not checked.  PSYCHIATRIC: The patient is alert but remains very confused. SKIN: No obvious rash, lesion, or ulcer.  Physical Exam LABORATORY PANEL:   CBC  Recent Labs Lab 06/20/15 0509  WBC 7.3  HGB 9.5*  HCT 28.7*  PLT 201    ------------------------------------------------------------------------------------------------------------------  Chemistries   Recent Labs Lab 06/19/15 0301 06/20/15 0509  NA 143 144  K 5.5* 3.9  CL 107 108  CO2 31 32  GLUCOSE 101* 73  BUN 54* 45*  CREATININE 1.43* 1.30*  CALCIUM 10.3 9.7  AST 18  --   ALT 11*  --   ALKPHOS 59  --   BILITOT 0.7  --    ------------------------------------------------------------------------------------------------------------------  Cardiac Enzymes  Recent Labs Lab 06/19/15 0301  TROPONINI <0.03   ------------------------------------------------------------------------------------------------------------------  RADIOLOGY:  No results found.  ASSESSMENT AND PLAN:   * Altered mental status Likely secondary to pneumonia- better now, still not oriented- likely baseline dementia. There is no rising white cell count but he has thickening of airways and questionable infiltrate on his lower lung fields.  IV Rocephin for now. Stopped azithromycin. Patient is DO NOT RESUSCITATE.  * Hyperkalemia Oral Kayexalate. Rechecked normal..  * Anemia Stool guaiac is positive, Finding of dilation of biliary and pancreatic duct, possibility of mass in pancreas. Spoke to son, due to his baseline medical conditions, decreased functional status and dementia- he will not like to do any aggressive management of this.   Hb stable on follow up.   Oral iron on d/c.  * Chronic pain He is DO NOT RESUSCITATE and as per the records he appeared having hospice services at nursing home. He is on oral morphine solution, I will decrease the dose as he is presented with altered mental status.  * Constipation Continue MiraLAX.   I spoke to son on phone today, he was not able to walk and almost wheel chair dependent for last 6 months.  All the records are reviewed and case discussed with  Care Management/Social Workerr. Management plans  discussed with the patient, family and they are in agreement.  CODE STATUS: DNR  TOTAL TIME TAKING CARE OF THIS PATIENT: 30 minutes.   POSSIBLE D/C IN 1-2 DAYS, DEPENDING ON CLINICAL CONDITION.   Altamese Dilling M.D on 06/21/2015   Between 7am to 6pm - Pager - [phone]  After 6pm go to [email or web address] - password [email or web address] [facility]  [place] [facility]  Office  [phone]  CC: Primary care physician; Derwood Kaplan, MD

## 2015-06-21 NOTE — Care Management Important Message (Signed)
Important Message  Patient Details  Name: Karl Mills MRN: [id number] Date of Birth: 1932/03/22   Medicare Important Message Given:  Yes-second notification given    Olegario Messier A Allmond 06/21/2015, 10:34 AM

## 2015-06-21 NOTE — Plan of Care (Signed)
Problem: Discharge Progression Outcomes Goal: Other Discharge Outcomes/Goals Outcome: Progressing Plan of care progress to goal: Pt uncooperative at times, will call out when he needs to urinate.   He is incontinent but does get agitated when he needs to go.   VSS Pt received one dose of Ativan for anxiousness. Pt remains in bed and is a high falls risk.

## 2015-06-21 NOTE — Progress Notes (Signed)
Took over care of patient from Danielle Jacobs at 1400. Brewer,Kimberly S, RN  

## 2015-06-21 NOTE — Progress Notes (Signed)
PT Cancellation Note  Patient Details Name: Karl Mills MRN: [id number] DOB: [birth year]   Cancelled Treatment:    Reason Eval/Treat Not Completed: Fatigue/lethargy limiting ability to participate (Consult received and chart reviewed.  Patient with increased agitation this AM, just received Ativan per RN.  Currently sleeping soundly and unable to participate with PT session.  Will re-attempt at later time/date as medically appropriate and patient able to participate.)   Kristen H. Manson Passey, PT, DPT, NCS 06/21/2015, 10:54 AM [phone]

## 2015-06-21 NOTE — Progress Notes (Signed)
   06/21/15 1025  Clinical Encounter Type  Visited With Patient  Visit Type Initial  Provided pastoral presence and support.  [employer] Elianna Windom-pager [phone]

## 2015-06-21 NOTE — Care Management (Signed)
Admitted to [facility] with the disgnosis of altered mental status/pneumonia. A resident of [facility] since 02/17/15. Son is Dondre Catalfamo [phone]). Primary care physician is Dr. Maryellen [place] in [place]. Rocephin and Azithromycin IV.  Physical therapy evaluation pending,  Gwenette Greet RN MSN Care Management [phone]

## 2015-06-21 NOTE — Plan of Care (Signed)
Problem: Discharge Progression Outcomes Goal: Other Discharge Outcomes/Goals Outcome: Progressing Plan of care progress to goal for: 1. Pain-no c/o pain this shift 2. Hemodynamically-             -VSS, pt remains afebrile this shift 3. Complications-pt very agitated and yelling out loudly, prn ativan given with improvement.  Attempted to wean O2 and pt sats dropped to 80% on RA, pt was placed back on O2 4. Diet-pt tolerating diet this shift 5. Activity-pt has good mobility in bed

## 2015-06-22 MED ORDER — OXYCODONE-ACETAMINOPHEN 10-325 MG PO TABS: 1.0000 | ORAL_TABLET | ORAL | Status: AC | PRN

## 2015-06-22 MED ORDER — FERROUS SULFATE 325 (65 FE) MG PO TABS: 325.0000 mg | ORAL_TABLET | Freq: Two times a day (BID) | ORAL | Status: AC

## 2015-06-22 MED ORDER — CEFUROXIME AXETIL 250 MG PO TABS: 250.0000 mg | ORAL_TABLET | Freq: Two times a day (BID) | ORAL | Status: AC

## 2015-06-22 MED ORDER — HALOPERIDOL 0.5 MG PO TABS: 0.5000 mg | ORAL_TABLET | Freq: Three times a day (TID) | ORAL | Status: AC | PRN

## 2015-06-22 NOTE — Progress Notes (Signed)
Patient is medically stable for D/C back to [facility] today with [facility] following. Per [facility] admissions coordinator at [facility] patient is going to room 33. RN will call report and arrange EMS for transport. Clinical Child psychotherapist (CSW) prepared D/C packet and sent D/C Summary to [employer] via carefinder. CSW contacted [facility] RN and made him aware of above. CSW contacted patient's son Jorja Loa and made him aware of above. Please reconsult if future social work needs arise. CSW signing off.   Jetta Lout, LCSWA [phone]

## 2015-06-22 NOTE — Discharge Summary (Signed)
Karl Mills   PATIENT NAME: Karl Mills    MR#:  [id number]  DATE OF BIRTH:  January 01, 1932  DATE OF ADMISSION:  06/19/2015 ADMITTING PHYSICIAN: Altamese Dilling, MD  DATE OF DISCHARGE: 06/22/2015  PRIMARY CARE PHYSICIAN: Derwood Kaplan, MD    ADMISSION DIAGNOSIS:  Blood in stool [K92.1] Dehydration [E86.0] Acute on chronic diastolic congestive heart failure [I50.33] Anemia, unspecified anemia type [D64.9]  DISCHARGE DIAGNOSIS:  Principal Problem:   Altered mental status Active Problems:   Pneumonia   SECONDARY DIAGNOSIS:   Past Medical History  Diagnosis Date  . COPD (chronic obstructive pulmonary disease)   . Atrial fibrillation   . MDD (major depressive disorder)   . Chronic pain syndrome   . Edema   . Dementia   . Atherosclerotic heart disease   . Insomnia   . GERD (gastroesophageal reflux disease)     HOSPITAL COURSE:   * Altered mental status secondary to pneumonia- better now, still not oriented- likely baseline dementia. There is no rising white cell count but he has thickening of airways and questionable infiltrate on his lower lung fields.  IV Rocephin for now. Stopped azithromycin. Patient is DO NOT RESUSCITATE.   He also had episodes of agitation in hospital, given Ativan and haldol as needed bases.  * Hyperkalemia Oral Kayexalate. Rechecked normal..  * Anemia Stool guaiac is positive, Finding of dilation of biliary and pancreatic duct, possibility of mass in pancreas. Spoke to son, due to his baseline medical conditions, decreased functional status and dementia- he will not like to do any aggressive management of this.  Hb stable on follow up.  Oral iron on d/c.  * Chronic pain He is DO NOT RESUSCITATE and as per the records he appeared having hospice services at nursing home. He is on oral morphine solution, I will decrease the dose as he is presented with altered mental  status.  * Constipation Continue MiraLAX.  DISCHARGE CONDITIONS:   Stable.  CONSULTS OBTAINED:     DRUG ALLERGIES:   Allergies  Allergen Reactions  . Penicillins Swelling  . Codeine Rash  . Sulfa Antibiotics Rash    DISCHARGE MEDICATIONS:   Current Discharge Medication List    START taking these medications   Details  cefUROXime (CEFTIN) 250 MG tablet Take 1 tablet (250 mg total) by mouth 2 (two) times daily with a meal. Qty: 8 tablet, Refills: 0    ferrous sulfate 325 (65 FE) MG tablet Take 1 tablet (325 mg total) by mouth 2 (two) times daily with a meal. Qty: 60 tablet, Refills: 3      CONTINUE these medications which have CHANGED   Details  haloperidol (HALDOL) 0.5 MG tablet Take 1 tablet (0.5 mg total) by mouth every 8 (eight) hours as needed for agitation. Qty: 30 tablet, Refills: 0      CONTINUE these medications which have NOT CHANGED   Details  albuterol (PROVENTIL HFA;VENTOLIN HFA) 108 (90 BASE) MCG/ACT inhaler Inhale 2 puffs into the lungs 2 (two) times daily.     amitriptyline (ELAVIL) 50 MG tablet Take 50 mg by mouth daily.     budesonide-formoterol (SYMBICORT) 160-4.5 MCG/ACT inhaler Inhale 2 puffs into the lungs 2 (two) times daily.    dextromethorphan-guaiFENesin (MUCINEX DM) 30-600 MG per 12 hr tablet Take 1 tablet by mouth at bedtime.     diltiazem (CARDIZEM CD) 300 MG 24 hr capsule Take 300 mg by mouth daily.    divalproex (  DEPAKOTE SPRINKLE) 125 MG capsule Take 250 mg by mouth 2 (two) times daily.    folic acid (FOLVITE) 400 MCG tablet Take 400 mcg by mouth daily.    furosemide (LASIX) 20 MG tablet Take 20 mg by mouth every other day.     gabapentin (NEURONTIN) 100 MG capsule Take 100 mg by mouth 3 (three) times daily.    hydrOXYzine (VISTARIL) 25 MG capsule Take 25 mg by mouth every 6 (six) hours as needed (agitation).     LORazepam (ATIVAN) 1 MG tablet Take 1 mg by mouth every 12 (twelve) hours as needed for anxiety.     magnesium  oxide (MAG-OX) 400 MG tablet Take 400 mg by mouth daily.    morphine (ROXANOL) 20 MG/ML concentrated solution Take 10 mg by mouth every 2 (two) hours as needed for severe pain.     nitroGLYCERIN (NITROSTAT) 0.4 MG SL tablet Place 0.4 mg under the tongue every 5 (five) minutes x 3 doses as needed for chest pain. Contact MD after first dose. Give 3 doses max over 15 min.    oxyCODONE-acetaminophen (PERCOCET) 10-325 MG per tablet Take 1 tablet by mouth every 4 (four) hours as needed for pain.    polyethylene glycol (MIRALAX / GLYCOLAX) packet Take 17 g by mouth daily. Mix with 8oz of water.    promethazine (PHENERGAN) 25 MG tablet Take 25 mg by mouth every 12 (twelve) hours as needed for nausea or vomiting.     ranitidine (ZANTAC) 150 MG tablet Take 150 mg by mouth at bedtime.     senna (SENOKOT) 8.6 MG tablet Take 2 tablets by mouth daily.     tiotropium (SPIRIVA) 18 MCG inhalation capsule Place 18 mcg into inhaler and inhale daily.    traZODone (DESYREL) 100 MG tablet Take 100 mg by mouth at bedtime.    UNABLE TO FIND 120 mLs by Per post-pyloric tube route 2 (two) times daily. Med Name: Med Plus      STOP taking these medications     naproxen sodium (ANAPROX) 220 MG tablet      QUEtiapine (SEROQUEL) 50 MG tablet          DISCHARGE INSTRUCTIONS:    Follow with PMD in 2 weeks. Pt need to be followed by Hospice in SNF.  If you experience worsening of your admission symptoms, develop shortness of breath, life threatening emergency, suicidal or homicidal thoughts you must seek medical attention immediately by calling 911 or calling your MD immediately  if symptoms less severe.  You Must read complete instructions/literature along with all the possible adverse reactions/side effects for all the Medicines you take and that have been prescribed to you. Take any new Medicines after you have completely understood and accept all the possible adverse reactions/side effects.   Please  note  You were cared for by a hospitalist during your hospital stay. If you have any questions about your discharge medications or the care you received while you were in the hospital after you are discharged, you can call the unit and asked to speak with the hospitalist on call if the hospitalist that took care of you is not available. Once you are discharged, your primary care physician will handle any further medical issues. Please note that NO REFILLS for any discharge medications will be authorized once you are discharged, as it is imperative that you return to your primary care physician (or establish a relationship with a primary care physician if you do not have one) for  your aftercare needs so that they can reassess your need for medications and monitor your lab values.    Today   CHIEF COMPLAINT:   Chief Complaint  Patient presents with  . Shortness of Breath    HISTORY OF PRESENT ILLNESS:  Paz Winsett  is a 81 y.o. male with a known history of COPD, atrial fibrillation, dementia, coronary artery disease who is a nursing home resident and having a DO NOT RESUSCITATE status over there, but at his baseline he is walking without support and able to carry out conversations. Last night he walk to the nursing station and he was appearing very lethargic he slumped over on the chair and the nurse checked his vitals his oxygen saturation was noted 84%, blood pressure was 97/46, so she called the doctor on-call and he suggested to send him to emergency room for further evaluation. Patient is drowsy but arousable easily but he appears very confused and not able to give me any history so all this information was obtained by calling the nurse at his nursing home who has sent him to hospital. I also tried to call his contact person Mr. Timothy Lalani, and left a message on his phone for call back. In ER he was noted to have slight drop in his hemoglobin compared to last year in his stool was noted  positive for guaiac, his urinalysis was negative and he did not had a rising his white blood cell count. CT scan of the abdomen was done which showed dilation of bile duct and pancreatic duct and some constipation, with some questionable infiltrate and thickening of the airways in both lower lung fields.   VITAL SIGNS:  Blood pressure 130/53, pulse 68, temperature 97.1 F (36.2 C), temperature source Axillary, resp. rate 18, height  (1.676 m), weight 48.762 kg (107 lb 8 oz), SpO2 93 %.  I/O:   Intake/Output Summary (Last 24 hours) at 06/22/15 0854 Last data filed at 06/22/15 0644  Gross per 24 hour  Intake      0 ml  Output    250 ml  Net   -250 ml    PHYSICAL EXAMINATION:   GENERAL: 81 y.o.-year-old patient thin and cachectic ,lying in the bed with no acute distress.  EYES: Pupils equal, round, reactive to light and accommodation. No scleral icterus. Extraocular muscles intact.  HEENT: Head atraumatic, normocephalic. Oropharynx and nasopharynx clear. Mucosa dry. NECK: Supple, no jugular venous distention. No thyroid enlargement, no tenderness.  LUNGS: Normal breath sounds bilaterally, no wheezing, some crepitations . No use of accessory muscles of respiration.  CARDIOVASCULAR: S1, S2 normal. No murmurs, rubs, or gallops.  ABDOMEN: Soft, nontender, nondistended. Bowel sounds present. No organomegaly or mass.  EXTREMITIES: No pedal edema, cyanosis, or clubbing.  NEUROLOGIC: Cranial nerves unable to check due to mental status. Muscle strength 4/5 in all extremities, moves spontaneously to stimuli but not following commands. Gait not checked.  PSYCHIATRIC: The patient is alert but remains very confused. SKIN: No obvious rash, lesion, or ulcer.  DATA REVIEW:   CBC  Recent Labs Lab 06/20/15 0509  WBC 7.3  HGB 9.5*  HCT 28.7*  PLT 201    Chemistries   Recent Labs Lab 06/19/15 0301 06/20/15 0509  NA 143 144  K 5.5* 3.9  CL 107 108  CO2 31 32  GLUCOSE  101* 73  BUN 54* 45*  CREATININE 1.43* 1.30*  CALCIUM 10.3 9.7  AST 18  --   ALT 11*  --  ALKPHOS 59  --   BILITOT 0.7  --     Cardiac Enzymes  Recent Labs Lab 06/19/15 0301  TROPONINI <0.03    Microbiology Results  Results for orders placed or performed during the hospital encounter of 06/19/15  MRSA PCR Screening     Status: None   Collection Time: 06/19/15 12:42 PM  Result Value Ref Range Status   MRSA by PCR NEGATIVE NEGATIVE Final    Comment:        The GeneXpert MRSA Assay (FDA approved for NASAL specimens only), is one component of a comprehensive MRSA colonization surveillance program. It is not intended to diagnose MRSA infection nor to guide or monitor treatment for MRSA infections.     RADIOLOGY:  No results found.    Management plans discussed with the patient, family and they are in agreement.  CODE STATUS:     Code Status Orders        Start     Ordered   06/19/15 1136  Do not attempt resuscitation (DNR)   Continuous    Question Answer Comment  In the event of cardiac or respiratory ARREST Do not call a "code blue"   In the event of cardiac or respiratory ARREST Do not perform Intubation, CPR, defibrillation or ACLS   In the event of cardiac or respiratory ARREST Use medication by any route, position, wound care, and other measures to relive pain and suffering. May use oxygen, suction and manual treatment of airway obstruction as needed for comfort.      06/19/15 1135    Advance Directive Documentation        Most Recent Value   Type of Advance Directive  Out of facility DNR (pink MOST or yellow form)   Pre-existing out of facility DNR order (yellow form or pink MOST form)     "MOST" Form in Place?        TOTAL TIME TAKING CARE OF THIS PATIENT: 35 minutes.    Altamese Dilling M.D on 06/22/2015 at 8:54 AM  Between 7am to 6pm - Pager - [phone]  After 6pm go to [email or web address] - password [email or web address] [facility]  [facility]  Office  [phone]  CC: Primary care physician; Derwood Kaplan, MD   Note: This dictation was prepared with Dragon dictation along with smaller phrase technology. Any transcriptional errors that result from this process are unintentional.

## 2015-06-22 NOTE — Plan of Care (Signed)
Problem: Discharge Progression Outcomes Goal: Other Discharge Outcomes/Goals Outcome: Progressing Plan of care progress to goal: Pt has dementia and is confused at times. VSS Pt cries out with needs.  Will use urinal at times and is incontinent at others. Sitter PRN. Pt remains in bed.

## 2015-09-02 DEATH — deceased
